# Patient Record
Sex: Female | Born: 1990 | Race: White | Hispanic: No | Marital: Single | Smoking: Never smoker
Health system: Southern US, Community
[De-identification: ages and names within clinical notes are randomized; demographics above are authoritative.]

## PROBLEM LIST (undated history)

## (undated) DIAGNOSIS — I498 Other specified cardiac arrhythmias: Secondary | ICD-10-CM

## (undated) DIAGNOSIS — G43909 Migraine, unspecified, not intractable, without status migrainosus: Secondary | ICD-10-CM

## (undated) DIAGNOSIS — F419 Anxiety disorder, unspecified: Secondary | ICD-10-CM

---
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
## 2011-03-29 ENCOUNTER — Emergency Department: Payer: Self-pay | Admitting: Unknown Physician Specialty

## 2011-10-25 ENCOUNTER — Emergency Department: Payer: Self-pay | Admitting: *Deleted

## 2011-10-26 ENCOUNTER — Emergency Department: Payer: Self-pay | Admitting: Internal Medicine

## 2012-01-15 ENCOUNTER — Emergency Department: Payer: Self-pay | Admitting: *Deleted

## 2012-06-19 ENCOUNTER — Emergency Department: Payer: Self-pay | Admitting: Emergency Medicine

## 2012-07-19 ENCOUNTER — Emergency Department: Payer: Self-pay | Admitting: Emergency Medicine

## 2012-09-25 ENCOUNTER — Emergency Department: Payer: Self-pay | Admitting: Emergency Medicine

## 2014-07-09 ENCOUNTER — Inpatient Hospital Stay: Admit: 2014-07-09 | Discharge: 2014-07-10

## 2014-07-09 NOTE — ED Notes (Signed)
Pt sent to discharge office with instructions, pt informed to follow up with PCP, all questions answered, pt stable at discharge      Purcell NailsStacy Rafi Kenneth, RN  07/09/14 1932

## 2014-07-09 NOTE — ED Provider Notes (Signed)
This patient was seen by mid-level provider only        [facility] ED  eMERGENCY dEPARTMENT eNCOUnter        Pt Name: Crystal Cole  MRN: [id number]  Birthdate 05-07-1991  Date of evaluation: 07/09/2014  Provider: Garald Balding, CNP  PCP: No primary provider on file.  ED Attending: No att. providers found    CHIEF COMPLAINT       Chief Complaint   Patient presents with   ??? Influenza     diahrrea around 1200 aches and c/o nausea to strong smells.        HISTORY OF PRESENT ILLNESS   (Location/Symptom, Timing/Onset, Context/Setting, Quality, Duration, Modifying Factors, Severity)  Note limiting factors.     Crystal Cole is a 23 y.o. female who presents to the emergency room complaining of chills and body aches with nausea.  The patient had one episode of diarrhea at noon and then starting having generalized body aches with chills.  She states that she feels nauseated whenever she smells something stronger.  She denies recent ill contacts.  Has not had a flu shot this year.  Denies any vomiting or known fever.    Nursing Notes were all reviewed and agreed with or any disagreements were addressed  in the HPI.    REVIEW OF SYSTEMS    (2-9 systems for level 4, 10 or more for level 5)     Review of Systems   Constitutional: Positive for chills. Negative for fever, activity change and appetite change.   HENT: Negative for congestion, drooling, sinus pressure, sore throat and trouble swallowing.    Respiratory: Negative for cough and shortness of breath.    Cardiovascular: Negative for chest pain.   Gastrointestinal: Negative for vomiting, abdominal pain and diarrhea.   Genitourinary: Negative for dysuria, frequency, hematuria, flank pain, vaginal bleeding, vaginal discharge and difficulty urinating.   All other systems reviewed and are negative.      Except as noted above in the ROS, all other systems were reviewed and negative.       PAST MEDICAL HISTORY   History reviewed. No pertinent past medical  history.      SURGICAL HISTORY     History reviewed. No pertinent past surgical history.      CURRENT MEDICATIONS     There are no discharge medications for this patient.        ALLERGIES     Review of patient's allergies indicates no known allergies.    FAMILY HISTORY     History reviewed. No pertinent family history.       SOCIAL HISTORY       History     Social History   ??? Marital Status: Single     Spouse Name: N/A     Number of Children: N/A   ??? Years of Education: N/A     Social History Main Topics   ??? Smoking status: Never Smoker    ??? Smokeless tobacco: None   ??? Alcohol Use: No   ??? Drug Use: No   ??? Sexual Activity: None     Other Topics Concern   ??? None     Social History Narrative   ??? None       SCREENINGS             PHYSICAL EXAM    (up to 7 for level 4, 8 or more for level 5)   ED Triage Vitals   BP Temp  Temp Source Pulse Resp SpO2 Height Weight   07/09/14 1827 07/09/14 1827 07/09/14 1827 07/09/14 1827 07/09/14 1827 07/09/14 1827 07/09/14 1827 07/09/14 1827   107/72 mmHg 99.9 ??F (37.7 ??C) Oral 93 16 95 % 5\' 5"  (1.651 m) 155 lb (70.308 kg)       Physical Exam   Constitutional: She is oriented to person, place, and time. She appears well-developed and well-nourished.   HENT:   Head: Normocephalic and atraumatic.   Right Ear: External ear normal.   Left Ear: External ear normal.   Nose: Nose normal.   Mouth/Throat: Oropharynx is clear and moist. No oropharyngeal exudate.   Neck: Normal range of motion. Neck supple.   Cardiovascular: Normal rate, regular rhythm, normal heart sounds and intact distal pulses.    Pulmonary/Chest: Effort normal and breath sounds normal. No respiratory distress. She has no wheezes.   Abdominal: Soft. Bowel sounds are normal. She exhibits no distension. There is no tenderness.   Musculoskeletal: Normal range of motion. She exhibits no edema or tenderness.   Lymphadenopathy:     She has no cervical adenopathy.   Neurological: She is oriented to person, place, and time.   Skin: Skin  is warm and dry. No rash noted. She is not diaphoretic. No erythema. No pallor.   Psychiatric: She has a normal mood and affect. Her behavior is normal. Judgment and thought content normal.   Nursing note and vitals reviewed.      DIAGNOSTIC RESULTS   LABS:    Labs Reviewed   RAPID INFLUENZA A/B ANTIGENS       All other labs were within normal range or not returned as of this dictation.    EKG: All EKG's are interpreted by the Emergency Department Physician who either signs or Co-signs this chart in the absence of a cardiologist.    NA    RADIOLOGY:   NA   PROCEDURES   Unless otherwise noted below, none     Procedures    CRITICAL CARE TIME   N/A    CONSULTS:  None      EMERGENCY DEPARTMENT COURSE and DIFFERENTIAL DIAGNOSIS/MDM:   Vitals:    Filed Vitals:    07/09/14 1827   BP: 107/72   Pulse: 93   Temp: 99.9 ??F (37.7 ??C)   TempSrc: Oral   Resp: 16   Height: 5\' 5"  (1.651 m)   Weight: 155 lb (70.308 kg)   SpO2: 95%       Patient was given the following medications:  Medications - No data to display  The patient is not febrile, tachycardic or toxic in appearance.  She presents a few hours after having one episode of diarrhea with chills and generalized body aches.  Flu swabs are negative.  No evidence for sinusitis, otitis or strep pharyngitis on exam.  She denies urinary symptoms and has no abdominal tenderness on exam.  There is no evidence for diverticulitis, appendicitis, pyelonephritis, UTI or acute surgical abdomen.  Symptoms are likely viral in nature.  The patient was given a work note for today.  Advised to rest and drink plenty of fluids.  She has ibuprofen and Tylenol at home and will take those as needed for pain.  She was given primary care referral and advised to return to the ER for any new or worse symptoms, including severe pain, persistent fevers, vomiting, new symptoms or other concerns.    The patient tolerated their visit well.  They were seen and evaluated by the  attending physician, No att.  providers found who agreed with the assessment and plan.  The patient and / or the family were informed of the results of any tests, a time was given to answer questions, a plan was proposed and they agreed with plan.        FINAL IMPRESSION      1. Diarrhea    2. Myalgia          DISPOSITION/PLAN   DISPOSITION Decision to Discharge    PAT[facility]:  [facility]  [address]  [place] [id number]  [phone]          DISCHARGE MEDICATIONS:  There are no discharge medications for this patient.      DISCONTINUED MEDICATIONS:  There are no discharge medications for this patient.             (Please note that portions of this note were completed with a voice recognition program.  Efforts were made to edit the dictations but occasionally words are mis-transcribed.)    Garald BaldingJACLYN R Yukio Bisping, CNP (electronically signed)            Garald BaldingJaclyn R Ennis Heavner, CNP  07/09/14 2306

## 2014-07-09 NOTE — Discharge Instructions (Signed)
Over the counter Ibuprofen or tylenol as needed for pain  Drink plenty of fluids  Get some rest    Return to the ER for new or worse symptoms or other concerns    Follow up with the primary care referral next week        Muscle Aches: Care Instructions  Your Care Instructions  Muscle aches have many possible causes. Some common ones are overuse, tension, and injuries such as a strained muscle. An infection such as the flu can cause muscle aches. Or the aches may be caused by some medicines, such as statins. Muscle aches may also be a symptom of a disease like lupus or fibromyalgia. Myalgia is the medical term for muscle aches.  The doctor will do a physical exam and ask questions to try to find what is causing your pain. You may also have tests such as blood tests or imaging tests like X-rays. These can help find or rule out serious problems.  The doctor has checked you carefully, but problems can develop later. If you notice any problems or new symptoms, get medical treatment right away.  Follow-up care is a key part of your treatment and safety. Be sure to make and go to all appointments, and call your doctor if you are having problems. It's also a good idea to know your test results and keep a list of the medicines you take.  How can you care for yourself at home?   Rest the area that hurts. You may need to stop or reduce the activity that causes your symptoms. Then you can return to it slowly.   Put ice or a cold pack on the area for 10 to 20 minutes at a time to ease pain. Put a thin cloth between the ice and your skin.   Take an over-the-counter pain medicine, such as acetaminophen (Tylenol), ibuprofen (Advil, Motrin), or naproxen (Aleve). Be safe with medicines. Read and follow all instructions on the label.  When should you call for help?  Call your doctor now or seek immediate medical care if:   Your pain gets worse.   You have new symptoms, such as a fever, swelling, or a rash.  Watch closely for  changes in your health, and be sure to contact your doctor if:   You do not get better as expected.   Where can you learn more?   Go to [email or web address] and sign in to your MyChart account. Enter G355 in the Search Health Information box to learn more about "Muscle Aches: Care Instructions."    If you do not have an account, please click on the "Sign Up Now" link.      2006-2015 [employer]wise, [employer]. Care instructions adapted under license by [facility]. This care instruction is for use with your licensed healthcare professional. If you have questions about a medical condition or this instruction, always ask your healthcare professional. Healthwise, Incorporated disclaims any warranty or liability for your use of this information.  Content Version: 10.6.465758; Current as of: July 01, 2013

## 2014-07-10 LAB — RAPID INFLUENZA A/B ANTIGENS
Rapid Influenza A Ag: NEGATIVE
Rapid Influenza B Ag: NEGATIVE

## 2014-09-27 ENCOUNTER — Emergency Department: Payer: Self-pay | Admitting: Emergency Medicine

## 2014-10-26 ENCOUNTER — Emergency Department: Payer: Self-pay | Admitting: Emergency Medicine

## 2015-05-08 ENCOUNTER — Emergency Department
Admission: EM | Admit: 2015-05-08 | Discharge: 2015-05-08 | Disposition: A | Discharge status: Home / Self Care | Payer: Self-pay | Attending: Emergency Medicine | Admitting: Emergency Medicine

## 2015-05-08 ENCOUNTER — Encounter: Payer: Self-pay | Admitting: Medical Oncology

## 2015-05-08 DIAGNOSIS — K029 Dental caries, unspecified: Secondary | ICD-10-CM | POA: Insufficient documentation

## 2015-05-08 DIAGNOSIS — K0889 Other specified disorders of teeth and supporting structures: Secondary | ICD-10-CM

## 2015-05-08 DIAGNOSIS — K088 Other specified disorders of teeth and supporting structures: Secondary | ICD-10-CM | POA: Insufficient documentation

## 2015-05-08 MED ORDER — AMOXICILLIN 500 MG PO TABS: 500.0000 mg | ORAL_TABLET | Freq: Two times a day (BID) | ORAL | Status: DC

## 2015-05-08 MED ORDER — HYDROCODONE-ACETAMINOPHEN 5-325 MG PO TABS: 1.0000 | ORAL_TABLET | ORAL | Status: DC | PRN

## 2015-05-08 MED ORDER — IBUPROFEN 800 MG PO TABS: 800.0000 mg | ORAL_TABLET | Freq: Three times a day (TID) | ORAL | Status: DC | PRN

## 2015-05-08 MED ORDER — LIDOCAINE VISCOUS 2 % MT SOLN: 20.0000 mL | OROMUCOSAL | Status: DC | PRN

## 2015-05-08 NOTE — ED Notes (Signed)
Wisdom tooth pain, willing to f/u up with low cost dentist.

## 2015-05-08 NOTE — ED Notes (Signed)
Lower left toothache x 1 week

## 2015-05-08 NOTE — ED Provider Notes (Signed)
[facility] Emergency Department Provider Note  ____________________________________________  Time seen: Approximately 11:56 AM  I have reviewed the triage vital signs and the nursing notes.   HISTORY  Chief Complaint Dental Pain    HPI Suzanne Day is a 24 y.o. female resents for evaluation of wisdom tooth pain. Worse left upper and lower molars area pain and irritation to the gums as well. Desires referral to local tenderness.   History reviewed. No pertinent past medical history.  There are no active problems to display for this patient.   History reviewed. No pertinent past surgical history.  Current Outpatient Rx  Name  Route  Sig  Dispense  Refill  . amoxicillin (AMOXIL) 500 MG tablet   Oral   Take 1 tablet (500 mg total) by mouth 2 (two) times daily.   20 tablet   0   . HYDROcodone-acetaminophen (NORCO) 5-325 MG per tablet   Oral   Take 1-2 tablets by mouth every 4 (four) hours as needed for moderate pain.   8 tablet   0   . ibuprofen (ADVIL,MOTRIN) 800 MG tablet   Oral   Take 1 tablet (800 mg total) by mouth every 8 (eight) hours as needed.   30 tablet   0     Allergies Review of patient's allergies indicates no known allergies.  No family history on file.  Social History Social History  Substance Use Topics  . Smoking status: Never Smoker   . Smokeless tobacco: None  . Alcohol Use: No    Review of Systems Constitutional: No fever/chills Eyes: No visual changes. ENT: No sore throat. Positive for dental pain. Cardiovascular: Denies chest pain. Respiratory: Denies shortness of breath. Gastrointestinal: No abdominal pain.  No nausea, no vomiting.  No diarrhea.  No constipation. Genitourinary: Negative for dysuria. Musculoskeletal: Negative for back pain. Skin: Negative for rash. Neurological: Negative for headaches, focal weakness or numbness.  10-point ROS otherwise  negative.  ____________________________________________   PHYSICAL EXAM:  VITAL SIGNS: ED Triage Vitals  Enc Vitals Group     BP 05/08/15 1127 118/69 mmHg     Pulse Rate 05/08/15 1127 89     Resp 05/08/15 1127 16     Temp 05/08/15 1127 98.2 F (36.8 C)     Temp Source 05/08/15 1127 Oral     SpO2 05/08/15 1127 100 %     Weight 05/08/15 1127 150 lb (68.04 kg)     Height 05/08/15 1127 5\' 5"  (1.651 m)     Head Cir --      Peak Flow --      Pain Score 05/08/15 1128 10     Pain Loc --      Pain Edu? --      Excl. in GC? --     Constitutional: Alert and oriented. Well appearing and in no acute distress. Eyes: Conjunctivae are normal. PERRL. EOMI. Head: Atraumatic. Nose: No congestion/rhinnorhea. Mouth/Throat: Mucous membranes are moist.  Oropharynx non-erythematous. Positive and was treated dental caries noted with erythematous gums Neck: No stridor.   Cardiovascular: Normal rate, regular rhythm. Grossly normal heart sounds.  Good peripheral circulation. Respiratory: Normal respiratory effort.  No retractions. Lungs CTAB. Gastrointestinal: Soft and nontender. No distention. No abdominal bruits. No CVA tenderness. Musculoskeletal: No lower extremity tenderness nor edema.  No joint effusions. Neurologic:  Normal speech and language. No gross focal neurologic deficits are appreciated. No gait instability. Skin:  Skin is warm, dry and intact. No rash noted. Psychiatric: Mood  and affect are normal. Speech and behavior are normal.  ____________________________________________   LABS (all labs ordered are listed, but only abnormal results are displayed)  Labs Reviewed - No data to display ____________________________________________     PROCEDURES  Procedure(s) performed: None  Critical Care performed: No  ____________________________________________   INITIAL IMPRESSION / ASSESSMENT AND PLAN / ED COURSE  Pertinent labs & imaging results that were available during my  care of the patient were reviewed by me and considered in my medical decision making (see chart for details).  Acute dental pain with was teeth pain. Rx given for amoxicillin 500 mg 3 times a day Motrin 800 mg 3 times a day and 8 tablets of hydrocodone. Patient encouraged to follow-up with local dentist list given. She voices no other emergency medical complaints at this time. ____________________________________________   FINAL CLINICAL IMPRESSION(S) / ED DIAGNOSES  Final diagnoses:  Pain, dental      Evangeline Dakin, PA-C 05/08/15 1218  Myrna Blazer, MD 05/08/15 [phone]

## 2015-05-08 NOTE — Discharge Instructions (Signed)
Dental Pain °A tooth ache may be caused by cavities (tooth decay). Cavities expose the nerve of the tooth to air and hot or cold temperatures. It may come from an infection or abscess (also called a boil or furuncle) around your tooth. It is also often caused by dental caries (tooth decay). This causes the pain you are having. °DIAGNOSIS  °Your caregiver can diagnose this problem by exam. °TREATMENT  °· If caused by an infection, it may be treated with medications which kill germs (antibiotics) and pain medications as prescribed by your caregiver. Take medications as directed. °· Only take over-the-counter or prescription medicines for pain, discomfort, or fever as directed by your caregiver. °· Whether the tooth ache today is caused by infection or dental disease, you should see your dentist as soon as possible for further care. °SEEK MEDICAL CARE IF: °The exam and treatment you received today has been provided on an emergency basis only. This is not a substitute for complete medical or dental care. If your problem worsens or new problems (symptoms) appear, and you are unable to meet with your dentist, call or return to this location. °SEEK IMMEDIATE MEDICAL CARE IF:  °· You have a fever. °· You develop redness and swelling of your face, jaw, or neck. °· You are unable to open your mouth. °· You have severe pain uncontrolled by pain medicine. °MAKE SURE YOU:  °· Understand these instructions. °· Will watch your condition. °· Will get help right away if you are not doing well or get worse. °Document Released: 08/20/2005 Document Revised: 11/12/2011 Document Reviewed: 04/07/2008 °ExitCare® Patient Information ©2015 [employer]. This information is not intended to replace advice given to you by your health care provider. Make sure you discuss any questions you have with your health care provider. °OPTIONS FOR DENTAL FOLLOW UP CARE ° °[place] Department of Health and Human Services - Local Safety Net Dental  Clinics °http://www.ncdhhs.gov/dph/oralhealth/services/safetynetclinics.htm °  °[facility] (336-562-3123) ° °[facility] (919-933-9087) ° °[facility] (919-663-1744 ext [phone]) ° °[facility] ([phone]) ° °[facility] ([phone]) °This clinic caters to the indigent population and is on a lottery system. °Location: °[facility], [address], [place] °Clinic Hours: °Wednesdays from 6pm - 9pm, patients seen by a lottery system. °For dates, call or go to www.med.[facility].edu/[facility]/patients/Dental-[facility] °Services: °Cleanings, fillings and simple extractions. °Payment Options: °DENTAL WORK IS FREE OF CHARGE. Bring proof of income or support. °Best way to get seen: °Arrive at 5:15 pm - this is a lottery, NOT first come/first serve, so arriving earlier will not increase your chances of being seen. °  °  °[facility] °919-537-3737 °Select option 1 for emergencies °  °Location: °[facility], [address], [place] °Clinic Hours: °No walk-ins accepted - call the day before to schedule an appointment. °Check in times are 9:30 am and 1:30 pm. °Services: °Simple extractions, temporary fillings, pulpectomy/pulp debridement, uncomplicated abscess drainage. °Payment Options: °PAYMENT IS DUE AT THE TIME OF SERVICE.  Fee is usually $100-200, additional surgical procedures (e.g. abscess drainage) may be extra. °Cash, checks, Visa/MasterCard accepted.  Can file Medicaid if patient is covered for dental - patient should call case worker to check. °No discount for [facility] patients. °Best way to get seen: °MUST call the day before and get onto the schedule. Can usually be seen the next 1-2 days. No walk-ins accepted. °  °  °[facility] °[phone] °  °Location: °[facility], [address], [place] °Clinic Hours: °M, W, Th, F 8am or 1:30pm, Tues  9a or 1:30 - first come/first served. °Services: °Simple extractions, temporary fillings, uncomplicated abscess drainage.  You do not need to be an [place] resident. °Payment Options: °PAYMENT IS DUE AT THE TIME OF SERVICE. °Dental insurance, otherwise sliding scale - bring proof of income or support. °Depending on income and treatment needed, cost is usually $50-200. °Best way to get seen: °Arrive early as it is first come/first served. °  °  °[facility] °[phone] °  °Location: °[address] °Clinic Hours: °Mon-Thu 8a-5p °Services: °Most basic dental services including extractions and fillings. °Payment Options: °PAYMENT IS DUE AT THE TIME OF SERVICE. °Sliding scale, up to 50% off - bring proof if income or support. °Medicaid with dental option accepted. °Best way to get seen: °Call to schedule an appointment, can usually be seen within 2 weeks OR they will try to see walk-ins - show up at 8a or 2p (you may have to wait). °  °  °[facility] °919-245-2435 °[place] RESIDENTS ONLY °  °Location: °[facility], [address], [place], [place] [address] °Clinic Hours: By appointment only. °Monday - Thursday 8am-5pm, Friday 8am-12pm °Services: Cleanings, fillings, extractions. °Payment Options: °PAYMENT IS DUE AT THE TIME OF SERVICE. °Cash, Visa or MasterCard. Sliding scale - $30 minimum per service. °Best way to get seen: °Come in to office, complete packet and make an appointment - need proof of income °or support monies for each household member and proof of [place] residence. °Usually takes about a month to get in. °  °  °[facility] °[phone] °  °Location: °[address] [place] °Clinic Hours: Walk-in Urgent Care Dental Services are offered Monday-Friday mornings only. °The numbers of emergencies accepted daily is limited to the number of °providers available. °Maximum 15 -  Mondays, Wednesdays & Thursdays °Maximum 10 - Tuesdays & Fridays °Services: °You do not need to be a [place] resident to be seen for a dental emergency. °Emergencies are defined as pain, swelling, abnormal bleeding, or dental trauma. Walkins will receive x-rays if needed. °NOTE: Dental cleaning is not an emergency. °Payment Options: °PAYMENT IS DUE AT THE TIME OF SERVICE. °Minimum co-pay is $40.00 for uninsured patients. °Minimum co-pay is $3.00 for Medicaid with dental coverage. °Dental Insurance is accepted and must be presented at time of visit. °Medicare does not cover dental. °Forms of payment: Cash, credit card, checks. °Best way to get seen: °If not previously registered with the clinic, walk-in dental registration begins at 7:15 am and is on a first come/first serve basis. °If previously registered with the clinic, call to make an appointment. °  °  °[facility] °919-776-4359 °[place] RESIDENTS ONLY °  °Location: °[address], [place], [place] °Clinic Hours: °Mon-Thu 10a-2p °Services: Extractions only! °Payment Options: °FREE (donations accepted) - bring proof of income or support °Best way to get seen: °Call and schedule an appointment OR come at 8am on the 1st Monday of every month (except for holidays) when it is first come/first served. °  °  °[facility] °[phone] °  °Location: °[address], [place] °Clinic Hours: °Friday mornings °Services, Payment Options, Best way to get seen: °Call for info °

## 2015-05-20 DIAGNOSIS — Z792 Long term (current) use of antibiotics: Secondary | ICD-10-CM | POA: Insufficient documentation

## 2015-05-20 DIAGNOSIS — J029 Acute pharyngitis, unspecified: Secondary | ICD-10-CM | POA: Insufficient documentation

## 2015-05-20 DIAGNOSIS — K5900 Constipation, unspecified: Secondary | ICD-10-CM | POA: Insufficient documentation

## 2015-05-20 LAB — POCT RAPID STREP A: Streptococcus, Group A Screen (Direct): NEGATIVE

## 2015-05-20 NOTE — ED Notes (Signed)
Patient reports having cold symptoms for several days with fever.  Today with runny nose and sore throat.  Patient also reports feeling constipated (last bm 2 days ago).

## 2015-05-21 ENCOUNTER — Emergency Department
Admission: EM | Admit: 2015-05-21 | Discharge: 2015-05-21 | Disposition: A | Discharge status: Home / Self Care | Payer: Self-pay | Attending: Emergency Medicine | Admitting: Emergency Medicine

## 2015-05-21 DIAGNOSIS — J029 Acute pharyngitis, unspecified: Secondary | ICD-10-CM

## 2015-05-21 DIAGNOSIS — K5900 Constipation, unspecified: Secondary | ICD-10-CM

## 2015-05-21 NOTE — Discharge Instructions (Signed)
Return for worse pain fever above 102 or if you're not feeling any better in a couple days. Drink plenty of fluids. If you still hasn't stooled  by this afternoon you can try an over-the-counter laxative.

## 2015-05-21 NOTE — ED Provider Notes (Signed)
[facility] Emergency Department Provider Note  ____________________________________________  Time seen: Approximately 5:01 AM  I have reviewed the triage vital signs and the nursing notes.   HISTORY  Chief Complaint Sore Throat; URI; and Constipation    HPI Suzanne Day is a 24 y.o. female patient reports sneezing stuffy nose and sore throat for the last 3 days with fever. Patient is feeling better today when I see her at 5 AM. Strep test is negative. She reports she hadn't stooled in 2 days.  No past medical history on file.  There are no active problems to display for this patient.   No past surgical history on file.  Current Outpatient Rx  Name  Route  Sig  Dispense  Refill  . amoxicillin (AMOXIL) 500 MG tablet   Oral   Take 1 tablet (500 mg total) by mouth 2 (two) times daily.   20 tablet   0   . HYDROcodone-acetaminophen (NORCO) 5-325 MG per tablet   Oral   Take 1-2 tablets by mouth every 4 (four) hours as needed for moderate pain.   8 tablet   0   . ibuprofen (ADVIL,MOTRIN) 800 MG tablet   Oral   Take 1 tablet (800 mg total) by mouth every 8 (eight) hours as needed.   30 tablet   0   . lidocaine (XYLOCAINE) 2 % solution   Mouth/Throat   Use as directed 20 mLs in the mouth or throat as needed for mouth pain.   100 mL   0     Allergies Review of patient's allergies indicates no known allergies.  No family history on file.  Social History Social History  Substance Use Topics  . Smoking status: Never Smoker   . Smokeless tobacco: Not on file  . Alcohol Use: No    Review of Systems Constitutional: fever Eyes: No visual changes. ENT: sore throat. Cardiovascular: Denies chest pain. Respiratory: Denies shortness of breath. Gastrointestinal: No abdominal pain.  No nausea, no vomiting.  No diarrhea.  No constipation. Genitourinary: Negative for dysuria. Musculoskeletal: Negative for back pain. Skin: Negative for  rash. Neurological: Negative for headaches, focal weakness or numbness.  10-point ROS otherwise negative.  ____________________________________________   PHYSICAL EXAM:  VITAL SIGNS: ED Triage Vitals  Enc Vitals Group     BP 05/20/15 2318 118/75 mmHg     Pulse Rate 05/20/15 2318 74     Resp 05/20/15 2318 20     Temp 05/20/15 2318 98.2 F (36.8 C)     Temp Source 05/20/15 2318 Oral     SpO2 05/20/15 2318 98 %     Weight 05/20/15 2318 155 lb (70.308 kg)     Height 05/20/15 2318 5\' 5"  (1.651 m)     Head Cir --      Peak Flow --      Pain Score 05/20/15 2318 0     Pain Loc --      Pain Edu? --      Excl. in GC? --     Constitutional: Alert and oriented. Well appearing and in no acute distress. Eyes: Conjunctivae are normal. PERRL. EOMI. Head: Atraumatic. Nosecongestion Mouth/Throat: Mucous membranes are moist.  Oropharynx non-erythematous. Neck: No stridor.  Hematological/Lymphatic/Immunilogical: No cervical lymphadenopathy. No tenderness on palpation Cardiovascular: Normal rate, regular rhythm. Grossly normal heart sounds.  Good peripheral circulation. Respiratory: Normal respiratory effort.  No retractions. Lungs CTAB. Gastrointestinal: Soft and nontender. No distention. No abdominal bruits. No CVA tenderness. Musculoskeletal: No  lower extremity tenderness nor edema.  No joint effusions. Neurologic:  Normal speech and language. No gross focal neurologic deficits are appreciated.  Skin:  Skin is warm, dry and intact. No rash noted. Psychiatric: Mood and affect are normal. Speech and behavior are normal.  ____________________________________________   LABS (all labs ordered are listed, but only abnormal results are displayed)  Labs Reviewed  POCT RAPID STREP A    ____________________________________________  EKG   ____________________________________________  RADIOLOGY   ____________________________________________   PROCEDURES   ____________________________________________   INITIAL IMPRESSION / ASSESSMENT AND PLAN / ED COURSE  Pertinent labs & imaging results that were available during my care of the patient were reviewed by me and considered in my medical decision making (see chart for details).   ____________________________________________   FINAL CLINICAL IMPRESSION(S) / ED DIAGNOSES  Final diagnoses:  Sore throat  Constipation, unspecified constipation type      Arnaldo Natal, MD 05/21/15 [phone]

## 2016-01-27 ENCOUNTER — Encounter: Payer: Self-pay | Admitting: Emergency Medicine

## 2016-01-27 ENCOUNTER — Emergency Department
Admission: EM | Admit: 2016-01-27 | Discharge: 2016-01-27 | Disposition: A | Discharge status: Home / Self Care | Payer: Worker's Compensation | Attending: Emergency Medicine | Admitting: Emergency Medicine

## 2016-01-27 DIAGNOSIS — Y9389 Activity, other specified: Secondary | ICD-10-CM | POA: Diagnosis not present

## 2016-01-27 DIAGNOSIS — Y929 Unspecified place or not applicable: Secondary | ICD-10-CM | POA: Insufficient documentation

## 2016-01-27 DIAGNOSIS — X500XXA Overexertion from strenuous movement or load, initial encounter: Secondary | ICD-10-CM | POA: Diagnosis not present

## 2016-01-27 DIAGNOSIS — Y990 Civilian activity done for income or pay: Secondary | ICD-10-CM | POA: Diagnosis not present

## 2016-01-27 DIAGNOSIS — S20221A Contusion of right back wall of thorax, initial encounter: Secondary | ICD-10-CM

## 2016-01-27 DIAGNOSIS — S300XXA Contusion of lower back and pelvis, initial encounter: Secondary | ICD-10-CM | POA: Diagnosis not present

## 2016-01-27 DIAGNOSIS — S3992XA Unspecified injury of lower back, initial encounter: Secondary | ICD-10-CM | POA: Diagnosis present

## 2016-01-27 MED ORDER — MELOXICAM 7.5 MG PO TABS: 7.5000 mg | ORAL_TABLET | Freq: Every day | ORAL | Status: AC

## 2016-01-27 MED ORDER — CYCLOBENZAPRINE HCL 5 MG PO TABS: 5.0000 mg | ORAL_TABLET | Freq: Three times a day (TID) | ORAL | Status: DC | PRN

## 2016-01-27 NOTE — ED Provider Notes (Signed)
[facility] Emergency Department Provider Note ____________________________________________  Time seen: 1149  I have reviewed the triage vital signs and the nursing notes.  HISTORY  Chief Complaint  Back Pain and Work Related Injury  HPI Suzanne Day is a 25 y.o. female presents to the ED from work following a work-related incident. She describes she was working in her usual capacity as a Geophysicist/field seismologist. She was on the floor inspecting a wrapped pallet that was about 3 feet high. She describes the fork lift operator began to lift a rapid palate behind her, as he did so, the palate grazed her calf and her lower back. It also shifted forward, causing her to have to push back against the palate ahead of her. She presents today for evaluation symptoms. She denies cough, SOB, chest pain, or flank pain. She notes the right, lower back pain that is worse with prolonged walking and standing. Pain is 2/10at rest and 5/10 at worse.   History reviewed. No pertinent past medical history.  There are no active problems to display for this patient.  History reviewed. No pertinent past surgical history.  Current Outpatient Rx  Name  Route  Sig  Dispense  Refill  . amoxicillin (AMOXIL) 500 MG tablet   Oral   Take 1 tablet (500 mg total) by mouth 2 (two) times daily.   20 tablet   0   . cyclobenzaprine (FLEXERIL) 5 MG tablet   Oral   Take 1 tablet (5 mg total) by mouth every 8 (eight) hours as needed for muscle spasms.   12 tablet   0   . HYDROcodone-acetaminophen (NORCO) 5-325 MG per tablet   Oral   Take 1-2 tablets by mouth every 4 (four) hours as needed for moderate pain.   8 tablet   0   . ibuprofen (ADVIL,MOTRIN) 800 MG tablet   Oral   Take 1 tablet (800 mg total) by mouth every 8 (eight) hours as needed.   30 tablet   0   . lidocaine (XYLOCAINE) 2 % solution   Mouth/Throat   Use as directed 20 mLs in the mouth or throat as needed for mouth pain.    100 mL   0   . meloxicam (MOBIC) 7.5 MG tablet   Oral   Take 1 tablet (7.5 mg total) by mouth daily.   15 tablet   0    Allergies Review of patient's allergies indicates no known allergies.  No family history on file.  Social History Social History  Substance Use Topics  . Smoking status: Never Smoker   . Smokeless tobacco: None  . Alcohol Use: No   Review of Systems  Constitutional: Negative for fever. Cardiovascular: Negative for chest pain. Respiratory: Negative for shortness of breath. Gastrointestinal: Negative for abdominal pain, vomiting and diarrhea. Genitourinary: Negative for dysuria. Musculoskeletal: Positive for back pain. Skin: Negative for rash. Neurological: Negative for headaches, focal weakness or numbness. ____________________________________________  PHYSICAL EXAM:  VITAL SIGNS: ED Triage Vitals  Enc Vitals Group     BP 01/27/16 1129 122/82 mmHg     Pulse Rate 01/27/16 1129 75     Resp 01/27/16 1129 18     Temp 01/27/16 1129 98.2 F (36.8 C)     Temp Source 01/27/16 1129 Oral     SpO2 01/27/16 1129 99 %     Weight 01/27/16 1129 165 lb (74.844 kg)     Height 01/27/16 1129  (1.651 m)  Head Cir --      Peak Flow --      Pain Score 01/27/16 1129 8     Pain Loc --      Pain Edu? --      Excl. in GC? --    Constitutional: Alert and oriented. Well appearing and in no distress. Head: Normocephalic and atraumatic. Cardiovascular: Normal rate, regular rhythm.  Respiratory: Normal respiratory effort. No wheezes/rales/rhonchi. Gastrointestinal: Soft and nontender. No distention, rebound, guarding, or organomegaly. No CVA tenderness. Musculoskeletal: Normal spinal alignment without midline tenderness, spasm, deformity, or step-off. Patient is tender to palpation over the right paraspinal muscles of the lumbar sacral junction. She otherwise transitions well from supine to sit. Nontender with normal range of motion in all extremities.   Neurologic: Nerves II through XII grossly intact. Normal LE DTRs bilaterally. Normal gait without ataxia. Normal speech and language. No gross focal neurologic deficits are appreciated. Skin:  Skin is warm, dry and intact. No rash noted. No abrasion, ecchymosis, swelling, or bruising noted about the posterior chest or back. ____________________________________________  INITIAL IMPRESSION / ASSESSMENT AND PLAN / ED COURSE  Patient with a lumbar contusion without evidence of respiratory distress. She'll be discharged with prescriptions for Flexeril and meloxicam the dose as needed for pain. She will return to work as scheduled after the holiday. She will follow-up with her companies urgent care provider, [facility], for ongoing symptom management. ____________________________________________  FINAL CLINICAL IMPRESSION(S) / ED DIAGNOSES  Final diagnoses:  Back contusion, right, initial encounter     Lissa HoardJenise V Bacon Charlea Nardo, PA-C 01/27/16 1257  Phineas SemenGraydon Goodman, MD 01/27/16 1553

## 2016-01-27 NOTE — ED Notes (Signed)
Patient presents to the ED for back pain post getting squished between a forklift and a palette.  Patient states she did not fall or hit her head.  Patient denies any numbness or tingling in limbs.  Patient ambulatory without obvious difficulty at this time.  Patient is filing workman's comp.

## 2016-01-27 NOTE — Discharge Instructions (Signed)
Contusion A contusion is a deep bruise. Contusions happen when an injury causes bleeding under the skin. Symptoms of bruising include pain, swelling, and discolored skin. The skin may turn blue, purple, or yellow. HOME CARE   Rest the injured area.  If told, put ice on the injured area.  Put ice in a plastic bag.  Place a towel between your skin and the bag.  Leave the ice on for 20 minutes, 2-3 times per day.  If told, put light pressure (compression) on the injured area using an elastic bandage. Make sure the bandage is not too tight. Remove it and put it back on as told by your doctor.  If possible, raise (elevate) the injured area above the level of your heart while you are sitting or lying down.  Take over-the-counter and prescription medicines only as told by your doctor. GET HELP IF:  Your symptoms do not get better after several days of treatment.  Your symptoms get worse.  You have trouble moving the injured area. GET HELP RIGHT AWAY IF:   You have very bad pain.  You have a loss of feeling (numbness) in a hand or foot.  Your hand or foot turns pale or cold.   This information is not intended to replace advice given to you by your health care provider. Make sure you discuss any questions you have with your health care provider.   Document Released: 02/06/2008 Document Revised: 05/11/2015 Document Reviewed: 01/05/2015 Elsevier Interactive Patient Education 2016 [place] Injury Prevention Back injuries can be very painful. They can also be difficult to heal. After having one back injury, you are more likely to injure your back again. It is important to learn how to avoid injuring or re-injuring your back. The following tips can help you to prevent a back injury. WHAT SHOULD I KNOW ABOUT PHYSICAL FITNESS?  Exercise for 30 minutes per day on most days of the week or as told by your doctor. Make sure to:  Do aerobic exercises, such as walking, jogging,  biking, or swimming.  Do exercises that increase balance and strength, such as tai chi and yoga.  Do stretching exercises. This helps with flexibility.  Try to develop strong belly (abdominal) muscles. Your belly muscles help to support your back.  Stay at a healthy weight. This helps to decrease your risk of a back injury. WHAT SHOULD I KNOW ABOUT MY DIET?  Talk with your doctor about your overall diet. Take supplements and vitamins only as told by your doctor.  Talk with your doctor about how much calcium and vitamin D you need each day. These nutrients help to prevent weakening of the bones (osteoporosis).  Include good sources of calcium in your diet, such as:  Dairy products.  Green leafy vegetables.  Products that have had calcium added to them (fortified).  Include good sources of vitamin D in your diet, such as:  Milk.  Foods that have had vitamin D added to them. WHAT SHOULD I KNOW ABOUT MY POSTURE?  Sit up straight and stand up straight. Avoid leaning forward when you sit or hunching over when you stand.  Choose chairs that have good low-back (lumbar) support.  If you work at a desk, sit close to it so you do not need to lean over. Keep your chin tucked in. Keep your neck drawn back. Keep your elbows bent so your arms look like the letter "L" (right angle).  Sit high and close to the steering  wheel when you drive. Add a low-back support to your car seat, if needed.  Avoid sitting or standing in one position for very long. Take breaks to get up, stretch, and walk around at least one time every hour. Take breaks every hour if you are driving for long periods of time.  Sleep on your side with your knees slightly bent, or sleep on your back with a pillow under your knees. Do not lie on the front of your body to sleep. WHAT SHOULD I KNOW ABOUT LIFTING, TWISTING, AND REACHING Lifting and Heavy Lifting  Avoid heavy lifting, especially lifting over and over again. If  you must do heavy lifting:  Stretch before lifting.  Work slowly.  Rest between lifts.  Use a tool such as a cart or a dolly to move objects if one is available.  Make several small trips instead of carrying one heavy load.  Ask for help when you need it, especially when moving big objects.  Follow these steps when lifting:  Stand with your feet shoulder-width apart.  Get as close to the object as you can. Do not pick up a heavy object that is far from your body.  Use handles or lifting straps if they are available.  Bend at your knees. Squat down, but keep your heels off the floor.  Keep your shoulders back. Keep your chin tucked in. Keep your back straight.  Lift the object slowly while you tighten the muscles in your legs, belly, and butt. Keep the object as close to the center of your body as possible.  Follow these steps when putting down a heavy load:  Stand with your feet shoulder-width apart.  Lower the object slowly while you tighten the muscles in your legs, belly, and butt. Keep the object as close to the center of your body as possible.  Keep your shoulders back. Keep your chin tucked in. Keep your back straight.  Bend at your knees. Squat down, but keep your heels off the floor.  Use handles or lifting straps if they are available. Twisting and Reaching  Avoid lifting heavy objects above your waist.  Do not twist at your waist while you are lifting or carrying a load. If you need to turn, move your feet.  Do not bend over without bending at your knees.  Avoid reaching over your head, across a table, or for an object on a high surface.  WHAT ARE SOME OTHER TIPS?  Avoid wet floors and icy ground. Keep sidewalks clear of ice to prevent falls.   Do not sleep on a mattress that is too soft or too hard.   Keep items that you use often within easy reach.   Put heavier objects on shelves at waist level, and put lighter objects on lower or higher  shelves.  Find ways to lower your stress, such as:  Exercise.  Massage.  Relaxation techniques.  Talk with your doctor if you feel anxious or depressed. These conditions can make back pain worse.  Wear flat heel shoes with cushioned soles.  Avoid making quick (sudden) movements.  Use both shoulder straps when carrying a backpack.  Do not use any tobacco products, including cigarettes, chewing tobacco, or electronic cigarettes. If you need help quitting, ask your doctor.   This information is not intended to replace advice given to you by your health care provider. Make sure you discuss any questions you have with your health care provider.   Document Released: 02/06/2008 Document Revised:  01/04/2015 Document Reviewed: 08/24/2014 Elsevier Interactive Patient Education [employer].   Your exam is essentially normal. Follow-up with [facility] for continued symptoms. Apply ice to the back for continued pain relief. Take the prescription meds as directed.

## 2016-09-23 ENCOUNTER — Emergency Department: Payer: BLUE CROSS/BLUE SHIELD

## 2016-09-23 ENCOUNTER — Encounter: Payer: Self-pay | Admitting: Emergency Medicine

## 2016-09-23 ENCOUNTER — Emergency Department
Admission: EM | Admit: 2016-09-23 | Discharge: 2016-09-23 | Disposition: A | Discharge status: Home / Self Care | Payer: BLUE CROSS/BLUE SHIELD | Attending: Emergency Medicine | Admitting: Emergency Medicine

## 2016-09-23 DIAGNOSIS — Y929 Unspecified place or not applicable: Secondary | ICD-10-CM | POA: Insufficient documentation

## 2016-09-23 DIAGNOSIS — S8011XA Contusion of right lower leg, initial encounter: Secondary | ICD-10-CM | POA: Diagnosis not present

## 2016-09-23 DIAGNOSIS — S8991XA Unspecified injury of right lower leg, initial encounter: Secondary | ICD-10-CM | POA: Diagnosis present

## 2016-09-23 DIAGNOSIS — Y9389 Activity, other specified: Secondary | ICD-10-CM | POA: Diagnosis not present

## 2016-09-23 DIAGNOSIS — Y999 Unspecified external cause status: Secondary | ICD-10-CM | POA: Diagnosis not present

## 2016-09-23 DIAGNOSIS — W182XXA Fall in (into) shower or empty bathtub, initial encounter: Secondary | ICD-10-CM | POA: Insufficient documentation

## 2016-09-23 MED ORDER — HYDROCODONE-ACETAMINOPHEN 5-325 MG PO TABS: 1.0000 | ORAL_TABLET | ORAL | 0 refills | Status: DC | PRN

## 2016-09-23 MED ORDER — MORPHINE SULFATE (PF) 4 MG/ML IV SOLN
INTRAVENOUS | Status: AC
  Administered 2016-09-23: 4 mg via INTRAVENOUS
  Filled 2016-09-23: qty 1

## 2016-09-23 MED ORDER — ONDANSETRON HCL 4 MG/2ML IJ SOLN
4.0000 mg | Freq: Once | INTRAMUSCULAR | Status: AC
  Administered 2016-09-23: 4 mg via INTRAVENOUS

## 2016-09-23 MED ORDER — ONDANSETRON HCL 4 MG/2ML IJ SOLN
INTRAMUSCULAR | Status: AC
  Administered 2016-09-23: 4 mg via INTRAVENOUS
  Filled 2016-09-23: qty 2

## 2016-09-23 MED ORDER — MORPHINE SULFATE (PF) 4 MG/ML IV SOLN
4.0000 mg | Freq: Once | INTRAVENOUS | Status: AC
  Administered 2016-09-23: 4 mg via INTRAVENOUS

## 2016-09-23 NOTE — ED Provider Notes (Signed)
[facility] Emergency Department Provider Note  Time seen: 7:20 PM  I have reviewed the triage vital signs and the nursing notes.   HISTORY  Chief Complaint Leg Injury    HPI Suzanne Day is a 26 y.o. female with no past medical history who presents the emergent department with right leg pain. According to the patient partially 2 hours ago she was trying to get into the bathtub when she slipped hitting her right tibia on the bathtub ledge causing her to fall into the bathtub. Denies any other injuries. Denies hitting her head or loss of consciousness. Currently the patient appears well, no distress however significant pain when touching the area.  History reviewed. No pertinent past medical history.  There are no active problems to display for this patient.   History reviewed. No pertinent surgical history.  Prior to Admission medications   Medication Sig Start Date End Date Taking? Authorizing Provider  amoxicillin (AMOXIL) 500 MG tablet Take 1 tablet (500 mg total) by mouth 2 (two) times daily. 05/08/15   Charmayne Sheerharles M Beers, PA-C  cyclobenzaprine (FLEXERIL) 5 MG tablet Take 1 tablet (5 mg total) by mouth every 8 (eight) hours as needed for muscle spasms. 01/27/16   Jenise V Bacon Menshew, PA-C  HYDROcodone-acetaminophen (NORCO) 5-325 MG per tablet Take 1-2 tablets by mouth every 4 (four) hours as needed for moderate pain. 05/08/15   Charmayne Sheerharles M Beers, PA-C  ibuprofen (ADVIL,MOTRIN) 800 MG tablet Take 1 tablet (800 mg total) by mouth every 8 (eight) hours as needed. 05/08/15   Charmayne Sheerharles M Beers, PA-C  lidocaine (XYLOCAINE) 2 % solution Use as directed 20 mLs in the mouth or throat as needed for mouth pain. 05/08/15   Charmayne Sheerharles M Beers, PA-C  meloxicam (MOBIC) 7.5 MG tablet Take 1 tablet (7.5 mg total) by mouth daily. 01/27/16 01/26/17  Jenise V Bacon Menshew, PA-C    No Known Allergies  History reviewed. No pertinent family history.  Social History Social History   Substance Use Topics  . Smoking status: Never Smoker  . Smokeless tobacco: Never Used  . Alcohol use No    Review of Systems Constitutional: Negative for fever. Cardiovascular: Negative for chest pain. Respiratory: Negative for shortness of breath. Gastrointestinal: Negative for abdominal pain, vomiting and diarrhea. Musculoskeletal: Right lower leg pain Neurological: Negative for headache 10-point ROS otherwise negative.  ____________________________________________   PHYSICAL EXAM:  VITAL SIGNS: ED Triage Vitals  Enc Vitals Group     BP 09/23/16 1908 110/79     Pulse Rate 09/23/16 1908 69     Resp 09/23/16 1908 18     Temp 09/23/16 1908 98 F (36.7 C)     Temp src --      SpO2 09/23/16 1908 99 %     Weight 09/23/16 1901 165 lb (74.8 kg)     Height 09/23/16 1901 5\' 5"  (1.651 m)     Head Circumference --      Peak Flow --      Pain Score 09/23/16 1902 5     Pain Loc --      Pain Edu? --      Excl. in GC? --     Constitutional: Alert and oriented. Well appearing and in no distress. Eyes: Normal exam ENT   Head: Normocephalic and atraumatic   Mouth/Throat: Mucous membranes are moist. Cardiovascular: Normal rate, regular rhythm.  Respiratory: Normal respiratory effort without tachypnea nor retractions. Breath sounds are clear  Gastrointestinal: Soft and nontender.  No distention Musculoskeletal: Contusion/ecchymosis noted to the mid to distal tibia, with significant tenderness to palpation of this area. No obvious deformity. Neurovascular intact with 2+ DP pulse. Able to move all toes. Skin intact. Neurologic:  Normal speech and language. No gross focal neurologic deficits  Psychiatric: Mood and affect are normal.   ____________________________________________   RADIOLOGY  X-ray negative  ____________________________________________   INITIAL IMPRESSION / ASSESSMENT AND PLAN / ED COURSE  Pertinent labs & imaging results that were available during my  care of the patient were reviewed by me and considered in my medical decision making (see chart for details).  The patient presents the emergency department with right leg pain after fall onto her bathtub ledge. Patient has significant tenderness to palpation. We will treat pain and obtain an x-ray to further evaluate. Overall the patient appears well, denies any other injuries, no other signs of trauma on exam.  X-ray is negative. We will discharge with crutches with weightbearing as tolerated, pain medication and primary care follow-up. ____________________________________________   FINAL CLINICAL IMPRESSION(S) / ED DIAGNOSES  Right leg contusion    Minna Antis, MD 09/23/16 2040

## 2016-09-23 NOTE — Discharge Instructions (Signed)
Please use crutches and bear weight on her leg as tolerated. Please use pain medication as needed, as prescribed. Return to the emergency department for any increased pain or any other symptom [place] concerning to self. Otherwise please follow-up with a primary care physician in approximately 1 week for recheck and reevaluation.

## 2016-09-23 NOTE — ED Triage Notes (Signed)
Slipped in bath tub and injured rt leg in the tib/fib area. Given fentanyl 75 mcg by ems

## 2018-01-14 ENCOUNTER — Other Ambulatory Visit: Payer: Self-pay

## 2018-01-14 ENCOUNTER — Encounter: Payer: Self-pay | Admitting: Emergency Medicine

## 2018-01-14 ENCOUNTER — Emergency Department
Admission: EM | Admit: 2018-01-14 | Discharge: 2018-01-14 | Disposition: A | Discharge status: Home / Self Care | Payer: BLUE CROSS/BLUE SHIELD | Attending: Emergency Medicine | Admitting: Emergency Medicine

## 2018-01-14 DIAGNOSIS — Y929 Unspecified place or not applicable: Secondary | ICD-10-CM | POA: Insufficient documentation

## 2018-01-14 DIAGNOSIS — X58XXXA Exposure to other specified factors, initial encounter: Secondary | ICD-10-CM | POA: Diagnosis not present

## 2018-01-14 DIAGNOSIS — Z79899 Other long term (current) drug therapy: Secondary | ICD-10-CM | POA: Diagnosis not present

## 2018-01-14 DIAGNOSIS — Y9389 Activity, other specified: Secondary | ICD-10-CM | POA: Insufficient documentation

## 2018-01-14 DIAGNOSIS — Y999 Unspecified external cause status: Secondary | ICD-10-CM | POA: Diagnosis not present

## 2018-01-14 DIAGNOSIS — T18108A Unspecified foreign body in esophagus causing other injury, initial encounter: Secondary | ICD-10-CM

## 2018-01-14 DIAGNOSIS — T18128A Food in esophagus causing other injury, initial encounter: Secondary | ICD-10-CM | POA: Insufficient documentation

## 2018-01-14 DIAGNOSIS — R0989 Other specified symptoms and signs involving the circulatory and respiratory systems: Secondary | ICD-10-CM | POA: Diagnosis present

## 2018-01-14 LAB — POCT PREGNANCY, URINE: Preg Test, Ur: NEGATIVE

## 2018-01-14 LAB — COMPREHENSIVE METABOLIC PANEL
ALK PHOS: 57 U/L (ref 38–126)
ALT: 20 U/L (ref 14–54)
ANION GAP: 6 (ref 5–15)
AST: 25 U/L (ref 15–41)
Albumin: 3.8 g/dL (ref 3.5–5.0)
BILIRUBIN TOTAL: 0.4 mg/dL (ref 0.3–1.2)
BUN: 8 mg/dL (ref 6–20)
CALCIUM: 8.6 mg/dL — AB (ref 8.9–10.3)
CO2: 28 mmol/L (ref 22–32)
Chloride: 103 mmol/L (ref 101–111)
Creatinine, Ser: 0.69 mg/dL (ref 0.44–1.00)
GFR calc Af Amer: >60 mL/min (ref >60)
Glucose, Bld: 99 mg/dL (ref 65–99)
POTASSIUM: 3.3 mmol/L — AB (ref 3.5–5.1)
Sodium: 137 mmol/L (ref 135–145)
TOTAL PROTEIN: 7.1 g/dL (ref 6.5–8.1)

## 2018-01-14 LAB — URINALYSIS, COMPLETE (UACMP) WITH MICROSCOPIC
BACTERIA UA: NONE SEEN
Bilirubin Urine: NEGATIVE
GLUCOSE, UA: NEGATIVE mg/dL
HGB URINE DIPSTICK: NEGATIVE
Ketones, ur: 5 mg/dL — AB
NITRITE: NEGATIVE
PH: 6 (ref 5.0–8.0)
Protein, ur: 30 mg/dL — AB
SPECIFIC GRAVITY, URINE: 1.031 — AB (ref 1.005–1.030)

## 2018-01-14 LAB — LIPASE, BLOOD: Lipase: 25 U/L (ref 11–51)

## 2018-01-14 LAB — CBC
HEMATOCRIT: 37.5 % (ref 35.0–47.0)
Hemoglobin: 12.4 g/dL (ref 12.0–16.0)
MCH: 27.7 pg (ref 26.0–34.0)
MCHC: 33.1 g/dL (ref 32.0–36.0)
MCV: 83.6 fL (ref 80.0–100.0)
Platelets: 287 10*3/uL (ref 150–440)
RBC: 4.48 MIL/uL (ref 3.80–5.20)
RDW: 13.5 % (ref 11.5–14.5)
WBC: 8.8 10*3/uL (ref 3.6–11.0)

## 2018-01-14 LAB — TROPONIN I: Troponin I: <0.03 ng/mL (ref <0.03)

## 2018-01-14 NOTE — Discharge Instructions (Addendum)
We believe we got all the chicken out of your throat, return to the emergency room for increased pain, swelling, fever trouble swallowing trouble breathing, or other concerns.  Follow closely with primary care doctor.

## 2018-01-14 NOTE — ED Triage Notes (Addendum)
Patient ambulatory to triage with steady gait, without difficulty or distress noted; pt reports N/V today since 4pm, st "feels food lodged in throat"; c/o left upper abd pain; st had diarrhea last wk as well with blood noted

## 2018-01-14 NOTE — ED Provider Notes (Addendum)
[facility] Emergency Department Provider Note  ____________________________________________   I have reviewed the triage vital signs and the nursing notes. Where available I have reviewed prior notes and, if possible and indicated, outside hospital notes.    HISTORY  Chief Complaint Abdominal Pain    HPI Suzanne Day is a 27 y.o. female is here because she has a piece of chicken stuck in the back of her throat, she can see Epogen can get it out and it is making her gag.  She has had diarrhea in the past is not having diarrhea today, she did not have any vomiting associated chicken and felt that piece of chicken in the back of the throat that was making her gag.  She has no other complaints.  Witness of breath, able to handle secretions with no difficulty etc. He can see the chicken in her posterior pharynx and was try to get it out but unsuccessfully     History reviewed. No pertinent past medical history.  There are no active problems to display for this patient.   History reviewed. No pertinent surgical history.  Prior to Admission medications   Medication Sig Start Date End Date Taking? Authorizing Provider  amoxicillin (AMOXIL) 500 MG tablet Take 1 tablet (500 mg total) by mouth 2 (two) times daily. 05/08/15   Beers, Charmayne Sheer, PA-C  cyclobenzaprine (FLEXERIL) 5 MG tablet Take 1 tablet (5 mg total) by mouth every 8 (eight) hours as needed for muscle spasms. 01/27/16   Menshew, Charlesetta Ivory, PA-C  HYDROcodone-acetaminophen (NORCO/VICODIN) 5-325 MG tablet Take 1 tablet by mouth every 4 (four) hours as needed. 09/23/16   Minna Antis, MD  ibuprofen (ADVIL,MOTRIN) 800 MG tablet Take 1 tablet (800 mg total) by mouth every 8 (eight) hours as needed. 05/08/15   Beers, Charmayne Sheer, PA-C  lidocaine (XYLOCAINE) 2 % solution Use as directed 20 mLs in the mouth or throat as needed for mouth pain. 05/08/15   Beers, Charmayne Sheer, PA-C    Allergies Patient has no  known allergies.  No family history on file.  Social History Social History   Tobacco Use  . Smoking status: Never Smoker  . Smokeless tobacco: Never Used  Substance Use Topics  . Alcohol use: Yes    Comment: weekends  . Drug use: Yes    Types: Cocaine    Review of Systems Constitutional: No fever/chills Eyes: No visual changes. ENT: No sore throat. No stiff neck no neck pain Cardiovascular: Denies chest pain. Respiratory: Denies shortness of breath. Gastrointestinal:   See HPI genitourinary: Negative for dysuria. Musculoskeletal: Negative lower extremity swelling Skin: Negative for rash. Neurological: Negative for severe headaches, focal weakness or numbness.   ____________________________________________   PHYSICAL EXAM:  VITAL SIGNS: ED Triage Vitals [01/14/18 1919]  Enc Vitals Group     BP (!) 122/92     Pulse Rate 71     Resp 18     Temp 98.4 F (36.9 C)     Temp Source Oral     SpO2 100 %     Weight 160 lb (72.6 kg)     Height  (1.651 m)     Head Circumference      Peak Flow      Pain Score 4     Pain Loc      Pain Edu?      Excl. in GC?     Constitutional: Alert and oriented. Well appearing and in no acute distress.  Eyes: Conjunctivae are normal Head: Atraumatic HEENT: No congestion/rhinnorhea. Mucous membranes are moist.  Oropharynx non-erythematous With a tongue depressor I am able to see a small piece of chicken underneath the left tonsil which is causing her distress.  No erythema no swelling no other lesions noted Neck:   Nontender with no meningismus, no masses, no stridor Cardiovascular: Normal rate, regular rhythm. Grossly normal heart sounds.  Good peripheral circulation. Respiratory: Normal respiratory effort.  No retractions. Lungs CTAB. Abdominal: Soft and nontender. No distention. No guarding no rebound Back:  There is no focal tenderness or step off.  there is no midline tenderness there are no lesions noted. there is no CVA  tenderness Musculoskeletal: No lower extremity tenderness, no upper extremity tenderness. No joint effusions, no DVT signs strong distal pulses no edema Neurologic:  Normal speech and language. No gross focal neurologic deficits are appreciated.  Skin:  Skin is warm, dry and intact. No rash noted. Psychiatric: Mood and affect are normal. Speech and behavior are normal.  ____________________________________________   LABS (all labs ordered are listed, but only abnormal results are displayed)  Labs Reviewed  COMPREHENSIVE METABOLIC PANEL - Abnormal; Notable for the following components:      Result Value   Potassium 3.3 (*)    Calcium 8.6 (*)    All other components within normal limits  URINALYSIS, COMPLETE (UACMP) WITH MICROSCOPIC - Abnormal; Notable for the following components:   Color, Urine YELLOW (*)    APPearance CLOUDY (*)    Specific Gravity, Urine 1.031 (*)    Ketones, ur 5 (*)    Protein, ur 30 (*)    Leukocytes, UA LARGE (*)    All other components within normal limits  URINE CULTURE  LIPASE, BLOOD  CBC  TROPONIN I  POC URINE PREG, ED  POCT PREGNANCY, URINE    Pertinent labs  results that were available during my care of the patient were reviewed by me and considered in my medical decision making (see chart for details). ____________________________________________  EKG  I personally interpreted any EKGs ordered by me or triage Normal sinus rhythm at 64 bpm no acute ST elevation no acute ST depression normal axis unremarkable EKG ____________________________________________  RADIOLOGY  Pertinent labs & imaging results that were available during my care of the patient were reviewed by me and considered in my medical decision making (see chart for details). If possible, patient and/or family made aware of any abnormal findings.  No results found. ____________________________________________    PROCEDURES  Procedure(s) performed: Level of chicken, with a  nurse present holding the light I took a small alligator forceps and was able to retrieve the small piece of meat that was underneath her tonsil.  I do not see evidence of retained chicken.  Patient consented to procedure prior to it being done, timeout was performed, no sedation or medications were indicated tolerated without any complications  Procedures    Critical Care performed: None  ____________________________________________   INITIAL IMPRESSION / ASSESSMENT AND PLAN / ED COURSE  Pertinent labs & imaging results that were available during my care of the patient were reviewed by me and considered in my medical decision making (see chart for details).  There is a very small piece of chicken in the patient's posterior pharynx, after I pulled it out all of her symptoms went away, no evidence of swelling angioedema, oral or GI obstruction etc.  No evidence of respiratory pathology.  No evidence of anaphylaxis.  She just had a  piece of chicken stuck underneath her tonsil that was making her gag and she feels 100% better now.  We will have her follow close with primary care return precautions and follow-up given and understood.    ____________________________________________   FINAL CLINICAL IMPRESSION(S) / ED DIAGNOSES  Final diagnoses:  None      This chart was dictated using voice recognition software.  Despite best efforts to proofread,  errors can occur which can change meaning.      Jeanmarie Plant, MD 01/14/18 2135    Jeanmarie Plant, MD 01/14/18 2303

## 2018-01-16 LAB — URINE CULTURE

## 2018-08-10 ENCOUNTER — Encounter: Payer: Self-pay | Admitting: Emergency Medicine

## 2018-08-10 ENCOUNTER — Emergency Department: Payer: Self-pay

## 2018-08-10 DIAGNOSIS — R251 Tremor, unspecified: Secondary | ICD-10-CM | POA: Insufficient documentation

## 2018-08-10 DIAGNOSIS — R0789 Other chest pain: Secondary | ICD-10-CM | POA: Insufficient documentation

## 2018-08-10 DIAGNOSIS — F1410 Cocaine abuse, uncomplicated: Secondary | ICD-10-CM | POA: Insufficient documentation

## 2018-08-10 LAB — BASIC METABOLIC PANEL
Anion gap: 7 (ref 5–15)
BUN: 9 mg/dL (ref 6–20)
CALCIUM: 8.9 mg/dL (ref 8.9–10.3)
CO2: 27 mmol/L (ref 22–32)
Chloride: 105 mmol/L (ref 98–111)
Creatinine, Ser: 0.77 mg/dL (ref 0.44–1.00)
Glucose, Bld: 117 mg/dL — ABNORMAL HIGH (ref 70–99)
Potassium: 3.2 mmol/L — ABNORMAL LOW (ref 3.5–5.1)
Sodium: 139 mmol/L (ref 135–145)

## 2018-08-10 LAB — CBC
HCT: 39.2 % (ref 36.0–46.0)
Hemoglobin: 13 g/dL (ref 12.0–15.0)
MCH: 27.8 pg (ref 26.0–34.0)
MCHC: 33.2 g/dL (ref 30.0–36.0)
MCV: 83.8 fL (ref 80.0–100.0)
NRBC: 0 % (ref 0.0–0.2)
PLATELETS: 362 10*3/uL (ref 150–400)
RBC: 4.68 MIL/uL (ref 3.87–5.11)
RDW: 13.2 % (ref 11.5–15.5)
WBC: 9.2 10*3/uL (ref 4.0–10.5)

## 2018-08-10 LAB — POCT PREGNANCY, URINE: Preg Test, Ur: NEGATIVE

## 2018-08-10 LAB — TROPONIN I

## 2018-08-10 NOTE — ED Notes (Signed)
Pt assisted to wheelchair upon arrival; c/o chest pain since 3am, after using cocaine; says "I just can't get it to go away"; HR 125

## 2018-08-10 NOTE — ED Triage Notes (Signed)
Patient states that she used cocaine about 04:00 this morning and has had chest pain and feeling like her heart was racing since then.

## 2018-08-11 ENCOUNTER — Emergency Department
Admission: EM | Admit: 2018-08-11 | Discharge: 2018-08-11 | Disposition: A | Discharge status: Home / Self Care | Payer: Self-pay | Attending: Emergency Medicine | Admitting: Emergency Medicine

## 2018-08-11 DIAGNOSIS — F1410 Cocaine abuse, uncomplicated: Secondary | ICD-10-CM

## 2018-08-11 DIAGNOSIS — R0789 Other chest pain: Secondary | ICD-10-CM

## 2018-08-11 LAB — HEPATIC FUNCTION PANEL
ALBUMIN: 4.3 g/dL (ref 3.5–5.0)
ALK PHOS: 52 U/L (ref 38–126)
ALT: 12 U/L (ref 0–44)
AST: 16 U/L (ref 15–41)
BILIRUBIN TOTAL: 0.4 mg/dL (ref 0.3–1.2)
Bilirubin, Direct: <0.1 mg/dL (ref 0.0–0.2)
Total Protein: 7.6 g/dL (ref 6.5–8.1)

## 2018-08-11 LAB — CK: Total CK: 46 U/L (ref 38–234)

## 2018-08-11 LAB — TROPONIN I: Troponin I: <0.03 ng/mL (ref <0.03)

## 2018-08-11 MED ORDER — SODIUM CHLORIDE 0.9 % IV BOLUS
1000.0000 mL | Freq: Once | INTRAVENOUS | Status: AC
  Administered 2018-08-11: 1000 mL via INTRAVENOUS

## 2018-08-11 MED ORDER — LORAZEPAM 2 MG/ML IJ SOLN
2.0000 mg | Freq: Once | INTRAMUSCULAR | Status: AC
  Administered 2018-08-11: 2 mg via INTRAVENOUS
  Filled 2018-08-11: qty 1

## 2018-08-11 NOTE — ED Provider Notes (Signed)
[facility] Emergency Department Provider Note  ____________________________________________   First MD Initiated Contact with Patient 08/11/18 0041     (approximate)  I have reviewed the triage vital signs and the nursing notes.   HISTORY  Chief Complaint Chest Pain   HPI Suzanne Day is a 27 y.o. female who comes to the emergency department with chest pain and tremors ever since insufflating cocaine roughly 20 hours ago.  She is a frequent cocaine user using every weekend for the past several months.  She has never had chest pain or tremors which concerned her.  Her chest pain was described as squeezing and aching in her upper chest.  It was constant and has slowly improved with time but she feels very anxious.  Denies fevers or chills.  No history of sudden cardiac death in the family.  No personal history of coronary artery disease.  No history of DVT or pulmonary embolism.  No recent surgery travel or immobilization.  Her symptoms were sudden onset and constant and seem to be worsened by cocaine use and improved with time.    History reviewed. No pertinent past medical history.  There are no active problems to display for this patient.   History reviewed. No pertinent surgical history.  Prior to Admission medications   Medication Sig Start Date End Date Taking? Authorizing Provider  amoxicillin (AMOXIL) 500 MG tablet Take 1 tablet (500 mg total) by mouth 2 (two) times daily. 05/08/15   Beers, Charmayne Sheer, PA-C  cyclobenzaprine (FLEXERIL) 5 MG tablet Take 1 tablet (5 mg total) by mouth every 8 (eight) hours as needed for muscle spasms. 01/27/16   Menshew, Charlesetta Ivory, PA-C  HYDROcodone-acetaminophen (NORCO/VICODIN) 5-325 MG tablet Take 1 tablet by mouth every 4 (four) hours as needed. 09/23/16   Minna Antis, MD  ibuprofen (ADVIL,MOTRIN) 800 MG tablet Take 1 tablet (800 mg total) by mouth every 8 (eight) hours as needed. 05/08/15   Beers, Charmayne Sheer, PA-C  lidocaine (XYLOCAINE) 2 % solution Use as directed 20 mLs in the mouth or throat as needed for mouth pain. 05/08/15   Beers, Charmayne Sheer, PA-C    Allergies Patient has no known allergies.  No family history on file.  Social History Social History   Tobacco Use  . Smoking status: Never Smoker  . Smokeless tobacco: Never Used  Substance Use Topics  . Alcohol use: Yes    Comment: occ  . Drug use: Yes    Types: Cocaine    Review of Systems Constitutional: No fever/chills Eyes: No visual changes. ENT: No sore throat. Cardiovascular: Positive for chest pain. Respiratory: Denies shortness of breath. Gastrointestinal: No abdominal pain.  No nausea, no vomiting.  No diarrhea.  No constipation. Genitourinary: Negative for dysuria. Musculoskeletal: Negative for back pain. Skin: Negative for rash. Neurological: Negative for headaches, focal weakness or numbness.   ____________________________________________   PHYSICAL EXAM:  VITAL SIGNS: ED Triage Vitals  Enc Vitals Group     BP 08/10/18 2020 122/81     Pulse Rate 08/10/18 2019 (!) 109     Resp 08/10/18 2019 18     Temp 08/10/18 2022 98.8 F (37.1 C)     Temp src --      SpO2 08/10/18 2019 100 %     Weight 08/10/18 2020 170 lb (77.1 kg)     Height 08/10/18 2020 5\' 5"  (1.651 m)     Head Circumference --      Peak Flow --  Pain Score 08/10/18 2020 6     Pain Loc --      Pain Edu? --      Excl. in GC? --     Constitutional: Alert and oriented x4 somewhat anxious appearing although nontoxic no diaphoresis speaks in full clear sentences Eyes: PERRL EOMI. slightly dilated and brisk Head: Atraumatic. Nose: No congestion/rhinnorhea. Mouth/Throat: No trismus Neck: No stridor.   Cardiovascular: Tachycardic rate, regular rhythm. Grossly normal heart sounds.  Good peripheral circulation. Respiratory: Normal respiratory effort.  No retractions. Lungs CTAB and moving good air Gastrointestinal: Soft  nontender Musculoskeletal: No lower extremity edema   Neurologic:  Normal speech and language. No gross focal neurologic deficits are appreciated. Skin:  Skin is warm, dry and intact. No rash noted. Psychiatric: Anxious appearing   ____________________________________________   DIFFERENTIAL includes but not limited to  Vasospasm, acute coronary syndrome, coronary artery dissection, cocaine intoxication, cocaine overdose, ventricular tachycardia ____________________________________________   LABS (all labs ordered are listed, but only abnormal results are displayed)  Labs Reviewed  BASIC METABOLIC PANEL - Abnormal; Notable for the following components:      Result Value   Potassium 3.2 (*)    Glucose, Bld 117 (*)    All other components within normal limits  CBC  TROPONIN I  HEPATIC FUNCTION PANEL  TROPONIN I  CK  POCT PREGNANCY, URINE    Lab work reviewed by me with no signs of acute ischemia x2 __________________________________________  EKG  ED ECG REPORT I, Merrily Brittle, the attending physician, personally viewed and interpreted this ECG.  Date: 08/12/2018 EKG Time:  Rate: 97 Rhythm: normal sinus rhythm QRS Axis: normal Intervals: normal ST/T Wave abnormalities: normal Narrative Interpretation: no evidence of acute ischemia  ____________________________________________  RADIOLOGY  X-ray reviewed by me with no acute disease ____________________________________________   PROCEDURES  Procedure(s) performed: yes  Angiocath insertion Performed by: Merrily Brittle  Consent: Verbal consent obtained. Risks and benefits: risks, benefits and alternatives were discussed Time out: Immediately prior to procedure a "time out" was called to verify the correct patient, procedure, equipment, support staff and site/side marked as required.  Preparation: Patient was prepped and draped in the usual sterile fashion.  Vein Location: Right antecubital fossa  Gauge:  20  Normal blood return and flush without difficulty Patient tolerance: Patient tolerated the procedure well with no immediate complications.     Procedures  Critical Care performed: no  ____________________________________________   INITIAL IMPRESSION / ASSESSMENT AND PLAN / ED COURSE  Pertinent labs & imaging results that were available during my care of the patient were reviewed by me and considered in my medical decision making (see chart for details).   As part of my medical decision making, I reviewed the following data within the electronic MEDICAL RECORD NUMBER History obtained from family if available, nursing notes, old chart and ekg, as well as notes from prior ED visits.  Patient comes to the emergency department with chest pain and palpitations roughly 20 hours after insufflating cocaine.  She does appear somewhat anxious so we have establish IV access and given her 2 mg of lorazepam as she is not driving.  Lab work is pending at this time.  The patient was kept on monitor multiple hours with no ectopy.  Her EKG is reassuring and troponin is negative x2.  She feels complete relief after lorazepam.  We had a lengthy discussion regarding the dangers of cocaine and she expresses a desire to stop using.  Discharged home in improved  condition.      ____________________________________________   FINAL CLINICAL IMPRESSION(S) / ED DIAGNOSES  Final diagnoses:  Atypical chest pain  Cocaine abuse (HCC)      NEW MEDICATIONS STARTED DURING THIS VISIT:  Discharge Medication List as of 08/11/2018  1:39 AM       Note:  This document was prepared using Dragon voice recognition software and may include unintentional dictation errors.     Merrily Brittleifenbark, Michalle Rademaker, MD 08/12/18 [phone]

## 2018-08-11 NOTE — Discharge Instructions (Signed)
Please refrain from using cocaine in the future - it's extremely dangerous for your health.  Fortunately today your lab work was reassuring.  Follow up with your PMD within 1 week for a recheck.  It was a pleasure to take care of you today, and thank you for coming to our emergency department.  If you have any questions or concerns before leaving please ask the nurse to grab me and I'm more than happy to go through your aftercare instructions again.  If you were prescribed any opioid pain medication today such as Norco, Vicodin, Percocet, morphine, hydrocodone, or oxycodone please make sure you do not drive when you are taking this medication as it can alter your ability to drive safely.  If you have any concerns once you are home that you are not improving or are in fact getting worse before you can make it to your follow-up appointment, please do not hesitate to call 911 and come back for further evaluation.  Merrily BrittleNeil Lollie Gunner, MD  Results for orders placed or performed during the hospital encounter of 08/11/18  Basic metabolic panel  Result Value Ref Range   Sodium 139 135 - 145 mmol/L   Potassium 3.2 (L) 3.5 - 5.1 mmol/L   Chloride 105 98 - 111 mmol/L   CO2 27 22 - 32 mmol/L   Glucose, Bld 117 (H) 70 - 99 mg/dL   BUN 9 6 - 20 mg/dL   Creatinine, Ser 1.610.77 0.44 - 1.00 mg/dL   Calcium 8.9 8.9 - 09.610.3 mg/dL   GFR calc non Af Amer >60 >60 mL/min   GFR calc Af Amer >60 >60 mL/min   Anion gap 7 5 - 15  CBC  Result Value Ref Range   WBC 9.2 4.0 - 10.5 K/uL   RBC 4.68 3.87 - 5.11 MIL/uL   Hemoglobin 13.0 12.0 - 15.0 g/dL   HCT 04.539.2 40.936.0 - 81.146.0 %   MCV 83.8 80.0 - 100.0 fL   MCH 27.8 26.0 - 34.0 pg   MCHC 33.2 30.0 - 36.0 g/dL   RDW 91.413.2 78.211.5 - 95.615.5 %   Platelets 362 150 - 400 K/uL   nRBC 0.0 0.0 - 0.2 %  Troponin I - ONCE - STAT  Result Value Ref Range   Troponin I <0.03 <0.03 ng/mL  Hepatic function panel  Result Value Ref Range   Total Protein 7.6 6.5 - 8.1 g/dL   Albumin 4.3 3.5 -  5.0 g/dL   AST 16 15 - 41 U/L   ALT 12 0 - 44 U/L   Alkaline Phosphatase 52 38 - 126 U/L   Total Bilirubin 0.4 0.3 - 1.2 mg/dL   Bilirubin, Direct <2.1<0.1 0.0 - 0.2 mg/dL   Indirect Bilirubin NOT CALCULATED 0.3 - 0.9 mg/dL  Troponin I - Once  Result Value Ref Range   Troponin I <0.03 <0.03 ng/mL  CK  Result Value Ref Range   Total CK 46 38 - 234 U/L  Pregnancy, urine POC  Result Value Ref Range   Preg Test, Ur NEGATIVE NEGATIVE   Dg Chest 2 View  Result Date: 08/10/2018 CLINICAL DATA:  Chest pain after cocaine use EXAM: CHEST - 2 VIEW COMPARISON:  None. FINDINGS: Lungs are clear.  No pleural effusion or pneumothorax. The heart is normal in size. Visualized osseous structures are within normal limits. IMPRESSION: Normal chest radiographs. Electronically Signed   By: Charline BillsSriyesh  Krishnan M.D.   On: 08/10/2018 21:09

## 2018-09-25 ENCOUNTER — Inpatient Hospital Stay (HOSPITAL_COMMUNITY): Admit: 2018-09-25 | Payer: Self-pay

## 2019-07-05 ENCOUNTER — Encounter: Payer: Self-pay | Admitting: Emergency Medicine

## 2019-07-05 ENCOUNTER — Other Ambulatory Visit: Payer: Self-pay

## 2019-07-05 DIAGNOSIS — R197 Diarrhea, unspecified: Secondary | ICD-10-CM | POA: Insufficient documentation

## 2019-07-05 DIAGNOSIS — Z5321 Procedure and treatment not carried out due to patient leaving prior to being seen by health care provider: Secondary | ICD-10-CM | POA: Insufficient documentation

## 2019-07-05 NOTE — ED Triage Notes (Signed)
Pt was seen by urgent care earlier today-was bitten by her cat around 9am today; superficial scratches to chest and shoulder, also pain and swelling to left ring finger for same reason; pt was put on amoxicillin; pt says she has taken one dose; now feeling jittery, nausea and diarrhea; diarrhea x 2; no vomiting; scratches with no redness or swelling on chest and back;

## 2019-07-06 ENCOUNTER — Emergency Department
Admission: EM | Admit: 2019-07-06 | Discharge: 2019-07-06 | Disposition: A | Discharge status: Home / Self Care | Payer: Self-pay | Attending: Emergency Medicine | Admitting: Emergency Medicine

## 2019-07-06 HISTORY — DX: Anxiety disorder, unspecified: F41.9

## 2019-07-06 HISTORY — DX: Other specified cardiac arrhythmias: I49.8

## 2019-07-06 NOTE — ED Notes (Addendum)
Pt up to desk saying she is going to leave; says she already has an appt for followup with her provider in the morning; pt says she will return if anything worsens

## 2019-09-09 ENCOUNTER — Encounter: Payer: Self-pay | Admitting: Emergency Medicine

## 2019-09-09 ENCOUNTER — Emergency Department: Payer: BC Managed Care – PPO

## 2019-09-09 ENCOUNTER — Emergency Department
Admission: EM | Admit: 2019-09-09 | Discharge: 2019-09-09 | Disposition: A | Discharge status: Home / Self Care | Payer: BC Managed Care – PPO | Attending: Student | Admitting: Student

## 2019-09-09 ENCOUNTER — Other Ambulatory Visit: Payer: Self-pay

## 2019-09-09 DIAGNOSIS — S8011XA Contusion of right lower leg, initial encounter: Secondary | ICD-10-CM | POA: Diagnosis not present

## 2019-09-09 DIAGNOSIS — Y929 Unspecified place or not applicable: Secondary | ICD-10-CM | POA: Insufficient documentation

## 2019-09-09 DIAGNOSIS — T148XXA Other injury of unspecified body region, initial encounter: Secondary | ICD-10-CM

## 2019-09-09 DIAGNOSIS — X58XXXA Exposure to other specified factors, initial encounter: Secondary | ICD-10-CM | POA: Insufficient documentation

## 2019-09-09 DIAGNOSIS — Y999 Unspecified external cause status: Secondary | ICD-10-CM | POA: Insufficient documentation

## 2019-09-09 DIAGNOSIS — Y939 Activity, unspecified: Secondary | ICD-10-CM | POA: Diagnosis not present

## 2019-09-09 DIAGNOSIS — M79604 Pain in right leg: Secondary | ICD-10-CM | POA: Diagnosis present

## 2019-09-09 NOTE — Discharge Instructions (Addendum)
Your exam and ultrasound are normal at this time. There is no evidence of a deep vein clot in the leg. You can apply ice and/or moist heat to reduce swelling.

## 2019-09-09 NOTE — ED Triage Notes (Signed)
Patient reports painful swelling, bruising and pain to right leg about 3 weeks ago. Patient states it started as bruising and denies any injury. States as the bruising faded, she began to have more significant pain. Reports now, anytime she is active and on her feet a lot, her leg will swell, especially around her knee.

## 2019-09-09 NOTE — ED Provider Notes (Signed)
[facility] Emergency Department Provider Note ____________________________________________  Time seen: 73  I have reviewed the triage vital signs and the nursing notes.  HISTORY  Chief Complaint  Leg Pain   HPI Suzanne Day is a 29 y.o. female presents as up to the ED for concern over  tenderness and resolving bruising to the inner right thigh.  Patient describes onset of of bruising to the inner thigh about 3 weeks prior.  She is unaware of any traumatic injury or contusion, preceding the onset of bruising.  She reported to a local urgent care a couple days later, and had lab work drawn which was negative and reassuring at the time.  She was advised to apply ice and moist heat and follow-up with a local ED if symptoms persisted.  She admits that her bruising subsequently faded, but she began to have continued pain in the inner thigh.  She also notes an area of superficial tenderness over what appears to be a varicose vein.  She denies any chest pain, shortness of breath, weakness, or syncope.  She denies any history of chronic ongoing knee pain.  She presents now for further evaluation of her symptoms.  Past Medical History:  Diagnosis Date  . Anxiety   . Sinus arrhythmia     There are no problems to display for this patient.   History reviewed. No pertinent surgical history.  Prior to Admission medications   Medication Sig Start Date End Date Taking? Authorizing Provider  amoxicillin (AMOXIL) 875 MG tablet Take 875 mg by mouth 2 (two) times daily.    [provider]    Allergies Patient has no known allergies.  No family history on file.  Social History Social History   Tobacco Use  . Smoking status: Never Smoker  . Smokeless tobacco: Never Used  Substance Use Topics  . Alcohol use: Never  . Drug use: Never    Review of Systems  Constitutional: Negative for fever. Cardiovascular: Negative for chest pain. Respiratory:  Negative for shortness of breath. Musculoskeletal: Negative for back pain. Skin: Negative for rash.  Right inner thigh swelling as above. Neurological: Negative for headaches, focal weakness or numbness. ____________________________________________  PHYSICAL EXAM:  VITAL SIGNS: ED Triage Vitals  Enc Vitals Group     BP 09/09/19 1840 122/72     Pulse Rate 09/09/19 1840 72     Resp 09/09/19 1840 16     Temp 09/09/19 1840 98.2 F (36.8 C)     Temp Source 09/09/19 1840 Oral     SpO2 09/09/19 1840 98 %     Weight 09/09/19 1838 166 lb (75.3 kg)     Height 09/09/19 1838 5\' 3"  (1.6 m)     Head Circumference --      Peak Flow --      Pain Score 09/09/19 1837 6     Pain Loc --      Pain Edu? --      Excl. in [place]? --     Constitutional: Alert and oriented. Well appearing and in no distress. Head: Normocephalic and atraumatic. Eyes: Conjunctivae are normal. Normal extraocular movements Cardiovascular: Normal rate, regular rhythm. Normal distal pulses. Respiratory: Normal respiratory effort. No wheezes/rales/rhonchi. Musculoskeletal: Right lower extremity without any obvious deformity or dislocation.  Right knee exam is overall benign reassuring at this time.  No popliteal space fullness noted.  No internal derangement of the knee is suspected.  Nontender with normal range of motion in all extremities.  Neurologic:  Cranial nerves II to XII grossly intact.  Normal gait without ataxia. Normal speech and language. No gross focal neurologic deficits are appreciated. Skin:  Skin is warm, dry and intact. No rash noted.  Patient is with a mild area to the proximal posterior calf that is tender to palpation.  He also appears to be a small superficial hematoma in the same area.  Inner thigh bruising, photo is currently resolved.  Patient does have a similar area bruising to the unaffected thigh, concerning for possible contusion related to work activities. Psychiatric: Mood and affect are normal.  Patient exhibits appropriate insight and judgment. ____________________________________________   RADIOLOGY  Venous Doppler US RLE  IMPRESSION: No evidence of deep venous thrombosis in the right lower extremity. ____________________________________________  PROCEDURES  Ace bandage Procedures ____________________________________________  INITIAL IMPRESSION / ASSESSMENT AND PLAN / ED COURSE  Patient with ED evaluation and concern of a possible right lower extremity DVT.  Patient presents with what appears to be local bruising following a minor contusion.  Work activities are probably the proximate cause of the patient's initial injury.  She is reassured by her exam and her DVT study at this time.  She is advised to wear an Ace bandage over the inner thigh to protect from further contusion, she is also given instruction to apply ice and/or heat to promote healing.  She understands self-limited course of local hematoma as well as some mild superficial phlebitis.  She will follow-up with primary provider return to the ED if necessary.  [name] was evaluated in Emergency Department on 09/09/2019 for the symptoms described in the history of present illness. She was evaluated in the context of the global COVID-19 pandemic, which necessitated consideration that the patient might be at risk for infection with the SARS-CoV-2 virus that causes COVID-19. Institutional protocols and algorithms that pertain to the evaluation of patients at risk for COVID-19 are in a state of rapid change based on information released by regulatory bodies including the CDC and federal and state organizations. These policies and algorithms were followed during the patient's care in the ED. ____________________________________________  FINAL CLINICAL IMPRESSION(S) / ED DIAGNOSES  Final diagnoses:  Contusion of multiple sites of right lower extremity, initial encounter  Hematoma      Geovanna Simko, Charlesetta Ivory,  PA-C 09/09/19 2121    Miguel Aschoff., MD 09/09/19 [phone]

## 2019-09-16 ENCOUNTER — Emergency Department: Payer: BC Managed Care – PPO

## 2019-09-16 ENCOUNTER — Encounter: Payer: Self-pay | Admitting: Emergency Medicine

## 2019-09-16 ENCOUNTER — Emergency Department
Admission: EM | Admit: 2019-09-16 | Discharge: 2019-09-16 | Disposition: A | Discharge status: Home / Self Care | Payer: BC Managed Care – PPO | Attending: Emergency Medicine | Admitting: Emergency Medicine

## 2019-09-16 ENCOUNTER — Other Ambulatory Visit: Payer: Self-pay

## 2019-09-16 DIAGNOSIS — X58XXXA Exposure to other specified factors, initial encounter: Secondary | ICD-10-CM | POA: Insufficient documentation

## 2019-09-16 DIAGNOSIS — Y999 Unspecified external cause status: Secondary | ICD-10-CM | POA: Insufficient documentation

## 2019-09-16 DIAGNOSIS — S86811A Strain of other muscle(s) and tendon(s) at lower leg level, right leg, initial encounter: Secondary | ICD-10-CM | POA: Diagnosis not present

## 2019-09-16 DIAGNOSIS — Y9289 Other specified places as the place of occurrence of the external cause: Secondary | ICD-10-CM | POA: Insufficient documentation

## 2019-09-16 DIAGNOSIS — M79604 Pain in right leg: Secondary | ICD-10-CM | POA: Diagnosis present

## 2019-09-16 DIAGNOSIS — S86911A Strain of unspecified muscle(s) and tendon(s) at lower leg level, right leg, initial encounter: Secondary | ICD-10-CM

## 2019-09-16 DIAGNOSIS — Y9389 Activity, other specified: Secondary | ICD-10-CM | POA: Diagnosis not present

## 2019-09-16 LAB — CBC WITH DIFFERENTIAL/PLATELET
Abs Immature Granulocytes: 0.01 10*3/uL (ref 0.00–0.07)
Basophils Absolute: 0 10*3/uL (ref 0.0–0.1)
Basophils Relative: 1 %
Eosinophils Absolute: 0.1 10*3/uL (ref 0.0–0.5)
Eosinophils Relative: 1 %
HCT: 36.3 % (ref 36.0–46.0)
Hemoglobin: 12.2 g/dL (ref 12.0–15.0)
Immature Granulocytes: 0 %
Lymphocytes Relative: 33 %
Lymphs Abs: 2.8 10*3/uL (ref 0.7–4.0)
MCH: 27.5 pg (ref 26.0–34.0)
MCHC: 33.6 g/dL (ref 30.0–36.0)
MCV: 81.8 fL (ref 80.0–100.0)
Monocytes Absolute: 0.6 10*3/uL (ref 0.1–1.0)
Monocytes Relative: 8 %
Neutro Abs: 4.9 10*3/uL (ref 1.7–7.7)
Neutrophils Relative %: 57 %
Platelets: 297 10*3/uL (ref 150–400)
RBC: 4.44 MIL/uL (ref 3.87–5.11)
RDW: 13 % (ref 11.5–15.5)
WBC: 8.4 10*3/uL (ref 4.0–10.5)
nRBC: 0 % (ref 0.0–0.2)

## 2019-09-16 LAB — COMPREHENSIVE METABOLIC PANEL
ALT: 11 U/L (ref 0–44)
AST: 18 U/L (ref 15–41)
Albumin: 4.4 g/dL (ref 3.5–5.0)
Alkaline Phosphatase: 45 U/L (ref 38–126)
Anion gap: 11 (ref 5–15)
BUN: 10 mg/dL (ref 6–20)
CO2: 24 mmol/L (ref 22–32)
Calcium: 9.2 mg/dL (ref 8.9–10.3)
Chloride: 104 mmol/L (ref 98–111)
Creatinine, Ser: 0.61 mg/dL (ref 0.44–1.00)
GFR calc Af Amer: >60 mL/min (ref >60)
GFR calc non Af Amer: >60 mL/min (ref >60)
Glucose, Bld: 92 mg/dL (ref 70–99)
Potassium: 3.7 mmol/L (ref 3.5–5.1)
Sodium: 139 mmol/L (ref 135–145)
Total Bilirubin: 0.5 mg/dL (ref 0.3–1.2)
Total Protein: 7.3 g/dL (ref 6.5–8.1)

## 2019-09-16 LAB — PROTIME-INR
INR: 1.1 (ref 0.8–1.2)
Prothrombin Time: 14.1 seconds (ref 11.4–15.2)

## 2019-09-16 MED ORDER — MELOXICAM 15 MG PO TABS: 15.0000 mg | ORAL_TABLET | Freq: Every day | ORAL | 0 refills | Status: DC

## 2019-09-16 MED ORDER — IOHEXOL 300 MG/ML  SOLN
100.0000 mL | Freq: Once | INTRAMUSCULAR | Status: AC | PRN
  Administered 2019-09-16: 100 mL via INTRAVENOUS
  Filled 2019-09-16: qty 100

## 2019-09-16 MED ORDER — MELOXICAM 7.5 MG PO TABS
15.0000 mg | ORAL_TABLET | Freq: Once | ORAL | Status: AC
  Administered 2019-09-16: 15 mg via ORAL
  Filled 2019-09-16: qty 2

## 2019-09-16 NOTE — ED Triage Notes (Signed)
Pt reports seen here for pain to her right leg and had an [place] but is still having pain. Pt called her MD and was told to just come to the ED. Denies re injury, states still hurting from several weeks ago.

## 2019-09-16 NOTE — ED Provider Notes (Signed)
[facility] Emergency Department Provider Note  ____________________________________________  Time seen: Approximately 7:17 PM  I have reviewed the triage vital signs and the nursing notes.   HISTORY  Chief Complaint Leg Pain    HPI Suzanne Day is a 29 y.o. female who presents the emergency department for further evaluation of pain, bruising, edema to the right lower extremity.  Patient was seen in this department a week ago, had a reassuring ultrasound with no evidence of DVT.  Patient had a solitary lesion to the proximal calf, bruising to the calf region.  Patient reports that she is having extending edema into the posterior knee, into the proximal thigh.  She does have underlying ecchymosis to this area.  She is having increasing pain as well.  No trauma to the area.  No history of DVT.  Patient denies any fevers or chills, abdominal pain.  No         Past Medical History:  Diagnosis Date  . Anxiety   . Sinus arrhythmia     There are no problems to display for this patient.   History reviewed. No pertinent surgical history.  Prior to Admission medications   Medication Sig Start Date End Date Taking? Authorizing Provider  amoxicillin (AMOXIL) 875 MG tablet Take 875 mg by mouth 2 (two) times daily.    [provider]  meloxicam (MOBIC) 15 MG tablet Take 1 tablet (15 mg total) by mouth daily. 09/16/19   Mostyn Varnell, Delorise Royals, PA-C    Allergies Patient has no known allergies.  No family history on file.  Social History Social History   Tobacco Use  . Smoking status: Never Smoker  . Smokeless tobacco: Never Used  Substance Use Topics  . Alcohol use: Never  . Drug use: Never     Review of Systems  Constitutional: No fever/chills Eyes: No visual changes. No discharge ENT: No upper respiratory complaints. Cardiovascular: no chest pain. Respiratory: no cough. No SOB. Gastrointestinal: No abdominal pain.  No nausea, no   Musculoskeletal: Positive for pain, swelling extending from the proximal calf into the thigh.  Underlying ecchymosis.  No trauma to the area. Skin: Negative for rash, abrasions, lacerations, ecchymosis. Neurological: Negative for headaches, focal weakness or numbness. 10-point ROS otherwise negative.  ____________________________________________   PHYSICAL EXAM:  VITAL SIGNS: ED Triage Vitals  Enc Vitals Group     BP 09/16/19 1746 124/65     Pulse Rate 09/16/19 1746 65     Resp 09/16/19 1746 16     Temp 09/16/19 1746 98.6 F (37 C)     Temp Source 09/16/19 1746 Oral     SpO2 09/16/19 1746 100 %     Weight 09/16/19 1701 166 lb (75.3 kg)     Height 09/16/19 1701 5\' 3"  (1.6 m)     Head Circumference --      Peak Flow --      Pain Score 09/16/19 1701 8     Pain Loc --      Pain Edu? --      Excl. in [place]? --      Constitutional: Alert and oriented. Well appearing and in no acute distress. Eyes: Conjunctivae are normal. PERRL. EOMI. Head: Atraumatic. ENT:      Ears:       Nose: No congestion/rhinnorhea.      Mouth/Throat: Mucous membranes are moist.  Neck: No stridor.    Cardiovascular: Normal rate, regular rhythm. Normal S1 and S2.  Good peripheral circulation. Respiratory:  Normal respiratory effort without tachypnea or retractions. Lungs CTAB. Good air entry to the bases with no decreased or absent breath sounds. Musculoskeletal: Full range of motion to all extremities. No gross deformities appreciated.  Visualization of the right lower extremity reveals edema extending from the proximal calf, along the popliteal region into the distal thigh.  This is most pronounced along the medial aspect of the leg.  Mild underlying ecchymosis.  Area is very tender to palpation.  No warmth or coolness to the area.  Dorsalis pedis pulse intact distally.  Sensation intact distally. Neurologic:  Normal speech and language. No gross focal neurologic deficits are appreciated.  Skin:  Skin is  warm, dry and intact. No rash noted. Psychiatric: Mood and affect are normal. Speech and behavior are normal. Patient exhibits appropriate insight and judgement.   ____________________________________________   LABS (all labs ordered are listed, but only abnormal results are displayed)  Labs Reviewed  COMPREHENSIVE METABOLIC PANEL  CBC WITH DIFFERENTIAL/PLATELET  PROTIME-INR  POC URINE PREG, ED   ____________________________________________  EKG   ____________________________________________  RADIOLOGY I personally viewed and evaluated these images as part of my medical decision making, as well as reviewing the written report by the radiologist.  CT FEMUR RIGHT W CONTRAST  Result Date: 09/16/2019 CLINICAL DATA:  Question of soft tissue infection pain to the proximal calf EXAM: CT OF THE LOWER RIGHT EXTREMITY WITH CONTRAST TECHNIQUE: Multidetector CT imaging of the lower right extremity was performed according to the standard protocol following intravenous contrast administration. COMPARISON:  None. CONTRAST:  144mL OMNIPAQUE IOHEXOL 300 MG/ML  SOLN FINDINGS: Bones/Joint/Cartilage No fracture or dislocation. Normal bone mineralization seen throughout. Joint spaces are well maintained. No hip or knee joint effusion. Ligaments Suboptimally assessed by CT. However there does appear to be some edema seen within the region of the ACL. The PCL is intact. Muscles and Tendons Normal appearance of the muscles surrounding the lower extremity without evidence of focal tear or atrophy. The tendons appear to be intact. Soft tissues The visualized portion of the deep pelvis is unremarkable. No focal soft tissue swelling or soft tissue fluid collection. IMPRESSION: No acute osseous abnormality. There is partially visualized edema seen within the region of the ACL which could be due to ligamentous injury. If further evaluation is required would recommend MRI. Electronically Signed   By: Prudencio Pair M.D.    On: 09/16/2019 22:50   US Venous Img Lower Unilateral Right  Result Date: 09/16/2019 CLINICAL DATA:  Worsening pain and swelling of right lower extremity EXAM: RIGHT LOWER EXTREMITY VENOUS DOPPLER ULTRASOUND TECHNIQUE: Gray-scale sonography with graded compression, as well as color Doppler and duplex ultrasound were performed to evaluate the lower extremity deep venous systems from the level of the common femoral vein and including the common femoral, femoral, profunda femoral, popliteal and calf veins including the posterior tibial, peroneal and gastrocnemius veins when visible. The superficial great saphenous vein was also interrogated. Spectral Doppler was utilized to evaluate flow at rest and with distal augmentation maneuvers in the common femoral, femoral and popliteal veins. COMPARISON:  09/09/2019 FINDINGS: Contralateral Common Femoral Vein: Respiratory phasicity is normal and symmetric with the symptomatic side. No evidence of thrombus. Normal compressibility. Common Femoral Vein: No evidence of thrombus. Normal compressibility, respiratory phasicity and response to augmentation. Saphenofemoral Junction: No evidence of thrombus. Normal compressibility and flow on color Doppler imaging. Profunda Femoral Vein: No evidence of thrombus. Normal compressibility and flow on color Doppler imaging. Femoral Vein: No evidence of thrombus.  Normal compressibility, respiratory phasicity and response to augmentation. Popliteal Vein: No evidence of thrombus. Normal compressibility, respiratory phasicity and response to augmentation. Calf Veins: No evidence of thrombus. Normal compressibility and flow on color Doppler imaging. Superficial Great Saphenous Vein: No evidence of thrombus. Normal compressibility. Venous Reflux:  None. Other Findings:  None. IMPRESSION: No evidence of deep venous thrombosis. Electronically Signed   By: Charlett Nose M.D.   On: 09/16/2019 20:07     ____________________________________________    PROCEDURES  Procedure(s) performed:    Procedures    Medications  meloxicam (MOBIC) tablet 15 mg (has no administration in time range)  iohexol (OMNIPAQUE) 300 MG/ML solution 100 mL (100 mLs Intravenous Contrast Given 09/16/19 2221)     ____________________________________________   INITIAL IMPRESSION / ASSESSMENT AND PLAN / ED COURSE  Pertinent labs & imaging results that were available during my care of the patient were reviewed by me and considered in my medical decision making (see chart for details).  Review of the [place] CSRS was performed in accordance of the [facility] prior to dispensing any controlled drugs.           Patient's diagnosis is consistent with knee strain.  Patient presented to emergency department complaining of right leg pain, ecchymosis, edema.  Patient had been evaluated a week ago with bruising, pain to this region.  Area of edema which is much smaller and ultrasound revealed no evidence of DVT.  Patient states that she is continue to do her normal work activities, but the area to her leg has been increasing both with edema, and pain.  Ecchymosis was improving however.  Repeat ultrasound reveals no DVT.  CT was ordered given the increase in soft tissue edema.  No evidence of fluid-filled collection concerning for hematoma or abscess.  Patient does have edema originating around the ACL.  On my discussion with the patient of CT findings, she does report that playing sports in high school she sustained an injury with an ACL injury as well as a nondisplaced tibial plateau fracture.  This was treated conservatively at the time did not require surgery.  Patient reports that with her strenuous job with heavy lifting, twisting with heavy lifting she may have injured it but she does still does not remember a precipitating injury.  As such patient will be placed in a knee immobilizer, given anti-inflammatories and referred  to orthopedics.  Return precautions discussed with the patient.  Patient will follow up with orthopedics as described above.. Patient is given ED precautions to return to the ED for any worsening or new symptoms.     ____________________________________________  FINAL CLINICAL IMPRESSION(S) / ED DIAGNOSES  Final diagnoses:  Strain of right knee, initial encounter      NEW MEDICATIONS STARTED DURING THIS VISIT:  ED Discharge Orders         Ordered    meloxicam (MOBIC) 15 MG tablet  Daily     09/16/19 2316              This chart was dictated using voice recognition software/Dragon. Despite best efforts to proofread, errors can occur which can change the meaning. Any change was purely unintentional.    Racheal Patches, PA-C 09/16/19 2316    Shaune Pollack, MD 09/17/19 1259

## 2019-09-18 ENCOUNTER — Encounter: Payer: Self-pay | Admitting: Emergency Medicine

## 2021-03-10 ENCOUNTER — Encounter: Payer: Self-pay | Admitting: Emergency Medicine

## 2021-03-10 ENCOUNTER — Other Ambulatory Visit: Payer: Self-pay

## 2021-03-10 ENCOUNTER — Emergency Department
Admission: EM | Admit: 2021-03-10 | Discharge: 2021-03-10 | Disposition: A | Discharge status: Home / Self Care | Payer: Self-pay | Attending: Emergency Medicine | Admitting: Emergency Medicine

## 2021-03-10 ENCOUNTER — Ambulatory Visit: Payer: Self-pay | Attending: Internal Medicine

## 2021-03-10 DIAGNOSIS — T50Z95A Adverse effect of other vaccines and biological substances, initial encounter: Secondary | ICD-10-CM | POA: Insufficient documentation

## 2021-03-10 DIAGNOSIS — L299 Pruritus, unspecified: Secondary | ICD-10-CM | POA: Insufficient documentation

## 2021-03-10 DIAGNOSIS — R5383 Other fatigue: Secondary | ICD-10-CM | POA: Insufficient documentation

## 2021-03-10 DIAGNOSIS — Z23 Encounter for immunization: Secondary | ICD-10-CM

## 2021-03-10 DIAGNOSIS — R251 Tremor, unspecified: Secondary | ICD-10-CM | POA: Insufficient documentation

## 2021-03-10 DIAGNOSIS — M791 Myalgia, unspecified site: Secondary | ICD-10-CM | POA: Insufficient documentation

## 2021-03-10 DIAGNOSIS — T50B95A Adverse effect of other viral vaccines, initial encounter: Secondary | ICD-10-CM

## 2021-03-10 MED ORDER — ONDANSETRON 4 MG PO TBDP: 4.0000 mg | ORAL_TABLET | Freq: Three times a day (TID) | ORAL | 0 refills | Status: DC | PRN

## 2021-03-10 NOTE — ED Triage Notes (Signed)
Pt presents to ER from home via EMS with complaints of left arm pain, "shaking since 7pm" pt reports she received COVID vaccine first dose around 3 pm reports developed left arm pain, feeling tired. Pt talks in complete sentences no distress noted

## 2021-03-10 NOTE — ED Provider Notes (Signed)
[facility] Emergency Department Provider Note  ____________________________________________  Time seen: Approximately 9:31 PM  I have reviewed the triage vital signs and the nursing notes.   HISTORY  Chief Complaint Fatigue   HPI Suzanne Day is a 30 y.o. female presents emergency department for concern of adverse effect after receiving COVID-19 vaccination.  She received the first vaccination today around 3:00.  Shortly after, she noticed her eyelids were itching and she had some itching sensation in the back of her throat but never developed any sensation of tongue or throat swelling.  Around 730 this evening, she states that she began to have a "shaking episode" and has been exhausted with body aches since around that time.  Past Medical History:  Diagnosis Date   Anxiety    Sinus arrhythmia     There are no problems to display for this patient.   History reviewed. No pertinent surgical history.  Prior to Admission medications   Medication Sig Start Date End Date Taking? Authorizing Provider  ondansetron (ZOFRAN-ODT) 4 MG disintegrating tablet Take 1 tablet (4 mg total) by mouth every 8 (eight) hours as needed for nausea or vomiting. 03/10/21  Yes Becca Bayne B, FNP  amoxicillin (AMOXIL) 500 MG tablet Take 1 tablet (500 mg total) by mouth 2 (two) times daily. 05/08/15   Beers, Charmayne Sheer, PA-C  amoxicillin (AMOXIL) 875 MG tablet Take 875 mg by mouth 2 (two) times daily.    [provider]  cyclobenzaprine (FLEXERIL) 5 MG tablet Take 1 tablet (5 mg total) by mouth every 8 (eight) hours as needed for muscle spasms. 01/27/16   Menshew, Charlesetta Ivory, PA-C  HYDROcodone-acetaminophen (NORCO/VICODIN) 5-325 MG tablet Take 1 tablet by mouth every 4 (four) hours as needed. 09/23/16   Minna Antis, MD  ibuprofen (ADVIL,MOTRIN) 800 MG tablet Take 1 tablet (800 mg total) by mouth every 8 (eight) hours as needed. 05/08/15   Beers, Charmayne Sheer, PA-C   lidocaine (XYLOCAINE) 2 % solution Use as directed 20 mLs in the mouth or throat as needed for mouth pain. 05/08/15   Beers, Charmayne Sheer, PA-C  meloxicam (MOBIC) 15 MG tablet Take 1 tablet (15 mg total) by mouth daily. 09/16/19   Cuthriell, Delorise Royals, PA-C    Allergies Patient has no known allergies.  No family history on file.  Social History Social History   Tobacco Use   Smoking status: Never   Smokeless tobacco: Never  Substance Use Topics   Alcohol use: Never    Comment: occ   Drug use: Never    Types: Cocaine    Review of Systems Constitutional: Negative for fever. ENT: Negative for sore throat. Respiratory: Negative for cough Gastrointestinal: No abdominal pain.  No nausea, no vomiting.  No diarrhea.  Musculoskeletal: Positive for generalized body aches. Skin: Negative for rash/lesion/wound. Neurological: Negative for headaches, focal weakness or numbness.  ____________________________________________   PHYSICAL EXAM:  VITAL SIGNS: ED Triage Vitals [03/10/21 2041]  Enc Vitals Group     BP 117/76     Pulse Rate 75     Resp 16     Temp 99.3 F (37.4 C)     Temp Source Oral     SpO2 98 %     Weight 170 lb (77.1 kg)     Height 5\' 4"  (1.626 m)     Head Circumference      Peak Flow      Pain Score 6     Pain Loc  Pain Edu?      Excl. in [place]?     Constitutional: Alert and oriented. Well appearing and in no acute distress. Eyes: Conjunctivae are normal.  Head: Atraumatic. Nose: No congestion/rhinnorhea. Mouth/Throat: Mucous membranes are moist.  Airways patent.  No evidence of edema Neck: No stridor.  Cardiovascular: Normal rate, regular rhythm. Good peripheral circulation. Respiratory: Normal respiratory effort. Musculoskeletal: Full ROM throughout.  Neurologic:  Normal speech and language. No gross focal neurologic deficits are appreciated. Speech is normal. No gait instability. Skin:  Skin is warm, dry and intact. No rash noted. Psychiatric: Mood  and affect are normal. Speech and behavior are normal.  ____________________________________________   LABS (all labs ordered are listed, but only abnormal results are displayed)  Labs Reviewed - No data to display ____________________________________________  EKG  Not indicated ____________________________________________  RADIOLOGY  Not indicated ____________________________________________   PROCEDURES  Not indicated ____________________________________________   INITIAL IMPRESSION / ASSESSMENT AND PLAN / ED COURSE     Pertinent labs & imaging results that were available during my care of the patient were reviewed by me and considered in my medical decision making (see chart for details).  Patient advised not to take the second vaccination.    Patient was advised to take Benadryl if needed.  She was advised to return to the emergency department for symptoms of concern.. ____________________________________________   FINAL CLINICAL IMPRESSION(S) / ED DIAGNOSES  Final diagnoses:  Fatigue after COVID-19 vaccination       Chinita Pester, FNP 03/10/21 2355    Chesley Noon, MD 03/14/21 [phone]

## 2021-03-10 NOTE — ED Notes (Signed)
First nurse note: Pt to ED via EMS from home c/o left arm soreness and shakiness today.  EMS states first pfizer covid shot today, VSS, NKA.

## 2021-03-10 NOTE — Progress Notes (Signed)
   Covid-19 Vaccination Clinic  Name:  SANIYYA GAU    MRN: [id number] DOB: June 22, 1991  03/10/2021  Ms. Cathell was observed post Covid-19 immunization for 15 minutes without incident. She was provided with Vaccine Information Sheet and instruction to access the V-Safe system.   Ms. Racanelli was instructed to call 911 with any severe reactions post vaccine: Difficulty breathing  Swelling of face and throat  A fast heartbeat  A bad rash all over body  Dizziness and weakness   Immunizations Administered     Name Date Dose VIS Date Route   PFIZER Comrnaty(Gray TOP) Covid-19 Vaccine 03/10/2021  2:49 PM 0.3 mL 08/11/2020 Intramuscular   Manufacturer: [employer], [employer]   Lot: [id number]   NDC: [address]-1      Drusilla Kanner, PharmD, MBA Clinical Acute Care Pharmacist

## 2021-03-10 NOTE — ED Notes (Signed)
Patient states that she received her first Covid vaccine about 15:00 today. Patient states that around 19:00 she started feeling tired and shaky. Patient also reports pain to her left arm that she states is from the vaccine.

## 2021-03-14 ENCOUNTER — Other Ambulatory Visit: Payer: Self-pay

## 2021-03-14 MED ORDER — COVID-19 MRNA VAC-TRIS(PFIZER) 30 MCG/0.3ML IM SUSP
INTRAMUSCULAR | 0 refills | Status: DC
Start: 2021-03-10 — End: 2021-04-10
  Filled 2021-03-14: qty 0.3, 30d supply, fill #0

## 2021-03-31 ENCOUNTER — Ambulatory Visit: Payer: Self-pay

## 2021-04-10 ENCOUNTER — Ambulatory Visit
Admission: EM | Admit: 2021-04-10 | Discharge: 2021-04-10 | Disposition: A | Discharge status: Home / Self Care | Payer: BC Managed Care – PPO | Attending: Family Medicine | Admitting: Family Medicine

## 2021-04-10 ENCOUNTER — Other Ambulatory Visit: Payer: Self-pay

## 2021-04-10 ENCOUNTER — Ambulatory Visit: Admission: EM | Admit: 2021-04-10 | Payer: Self-pay

## 2021-04-10 DIAGNOSIS — R519 Headache, unspecified: Secondary | ICD-10-CM

## 2021-04-10 DIAGNOSIS — R21 Rash and other nonspecific skin eruption: Secondary | ICD-10-CM | POA: Diagnosis not present

## 2021-04-10 DIAGNOSIS — R079 Chest pain, unspecified: Secondary | ICD-10-CM | POA: Diagnosis not present

## 2021-04-10 DIAGNOSIS — R0789 Other chest pain: Secondary | ICD-10-CM | POA: Diagnosis not present

## 2021-04-10 HISTORY — DX: Migraine, unspecified, not intractable, without status migrainosus: G43.909

## 2021-04-10 MED ORDER — TRIAMCINOLONE ACETONIDE 0.1 % EX OINT: 1.0000 "application " | TOPICAL_OINTMENT | Freq: Two times a day (BID) | CUTANEOUS | 0 refills | Status: AC

## 2021-04-10 MED ORDER — PROMETHAZINE HCL 25 MG PO TABS: 25.0000 mg | ORAL_TABLET | Freq: Three times a day (TID) | ORAL | 0 refills | Status: AC | PRN

## 2021-04-10 NOTE — ED Triage Notes (Signed)
Pt c/o headache and fatigue for several days. Pt also reports diarrhea, nausea and one bout of emesis earlier today. Pt also reports small red bumps that itch, several around her anal area that are painful. Pt also reports left-sided chest pain since Friday, constant sharp pain.

## 2021-04-10 NOTE — Discharge Instructions (Addendum)
Your exam and EKG were reassuring.  I believe that you are having migraine headaches. Use the phenergan and imitrex as directed.  Use the prescribed topical steroid for your rash.  Follow up with your PCP.  Take care  Dr. Adriana Simas

## 2021-04-11 NOTE — ED Provider Notes (Signed)
[facility]    CSN: [id number] Arrival date & time: 04/10/21  1302  History   Chief Complaint Chief Complaint  Patient presents with   Chest Pain   Rash    HPI 30 year old female presents with multiple complaints.  Patient reports that she has not been feeling well since Wednesday.  She states that she has had headache, fatigue, nausea, vomiting.  She has a history of migraine headaches.  She states that her headache has been on the left side.  Patient reports that her last bout of emesis was this morning.  Patient also reports that she has had ongoing chest pain since Friday.  Sharp pain.  Located on left side of the chest.  No reports of shortness of breath.  Additionally, patient reports that she has some "bumps" on her right arm.  She states that she has also had a bump around her anus that she recently drained.  No fever.  She rates her pain as 5/10 in severity at this time.  No other associated symptoms.  No other complaints.  Past Medical History:  Diagnosis Date   Anxiety    Migraine headache    Sinus arrhythmia     Home Medications    Prior to Admission medications   Medication Sig Start Date End Date Taking? Authorizing Provider  Magnesium Oxide 250 MG TABS magnesium 250 mg (as magnesium oxide) tablet  TAKE 1 TABLET BY MOUTH EVERY DAY 11/21/20  Yes [provider]  ondansetron (ZOFRAN-ODT) 4 MG disintegrating tablet Take 1 tablet (4 mg total) by mouth every 8 (eight) hours as needed for nausea or vomiting. 03/10/21  Yes Triplett, Cari B, FNP  promethazine (PHENERGAN) 25 MG tablet Take 1 tablet (25 mg total) by mouth every 8 (eight) hours as needed for nausea, vomiting or refractory nausea / vomiting. 04/10/21  Yes Ivonna Kinnick G, DO  SUMAtriptan (IMITREX) 50 MG tablet sumatriptan 50 mg tablet  PLEASE SEE ATTACHED FOR DETAILED DIRECTIONS   Yes [provider]  topiramate (TOPAMAX) 25 MG tablet Take 25 mg by mouth 2 (two) times daily. 11/21/20  Yes  [provider]  triamcinolone ointment (KENALOG) 0.1 % Apply 1 application topically 2 (two) times daily. 04/10/21  Yes Tommie Sams, DO    Family History History reviewed. No pertinent family history.  Social History Social History   Tobacco Use   Smoking status: Never   Smokeless tobacco: Never  Vaping Use   Vaping Use: Never used  Substance Use Topics   Alcohol use: Never    Comment: occ   Drug use: Never    Types: Cocaine     Allergies   Bee venom, Covid-19 (mrna) vaccine, and Cashew nut (anacardium occidentale) skin test   Review of Systems Review of Systems   Physical Exam Triage Vital Signs ED Triage Vitals  Enc Vitals Group     BP 04/10/21 1405 112/80     Pulse Rate 04/10/21 1405 75     Resp 04/10/21 1405 18     Temp 04/10/21 1405 98.5 F (36.9 C)     Temp Source 04/10/21 1405 Oral     SpO2 04/10/21 1405 100 %     Weight 04/10/21 1401 173 lb (78.5 kg)     Height 04/10/21 1401 5\' 4"  (1.626 m)     Head Circumference --      Peak Flow --      Pain Score 04/10/21 1401 5     Pain Loc --  Pain Edu? --      Excl. in GC? --    No data found.  Updated Vital Signs BP 112/80 (BP Location: Left Arm)   Pulse 75   Temp 98.5 F (36.9 C) (Oral)   Resp 18   Ht 5\' 4"  (1.626 m)   Wt 78.5 kg   LMP 03/22/2021 (Approximate)   SpO2 100%   BMI 29.70 kg/m   Visual Acuity Right Eye Distance:   Left Eye Distance:   Bilateral Distance:    Right Eye Near:   Left Eye Near:    Bilateral Near:     Physical Exam Vitals and nursing note reviewed. Exam conducted with a chaperone present.  Constitutional:      General: She is not in acute distress.    Appearance: Normal appearance. She is not ill-appearing.  HENT:     Head: Normocephalic and atraumatic.  Eyes:     General:        Right eye: No discharge.        Left eye: No discharge.     Conjunctiva/sclera: Conjunctivae normal.  Cardiovascular:     Rate and Rhythm: Normal rate and regular  rhythm.  Pulmonary:     Effort: Pulmonary effort is normal.     Breath sounds: Normal breath sounds. No wheezing or rales.  Genitourinary:    Rectum: Normal.  Skin:    Comments: Right forearm - 2 small red papules noted.   Neurological:     Mental Status: She is alert.  Psychiatric:        Mood and Affect: Mood normal.        Behavior: Behavior normal.     UC Treatments / Results  Labs (all labs ordered are listed, but only abnormal results are displayed) Labs Reviewed - No data to display  EKG Interpretation: Normal sinus rhythm with rate of 68.  Normal axis.  Normal intervals.  No ST or T wave changes.  Normal EKG.  Radiology No results found.  Procedures Procedures (including critical care time)  Medications Ordered in UC Medications - No data to display  Initial Impression / Assessment and Plan / UC Course  I have reviewed the triage vital signs and the nursing notes.  Pertinent labs & imaging results that were available during my care of the patient were reviewed by me and considered in my medical decision making (see chart for details).    30 year old female presents with multiple complaints.  I believe that the patient is experiencing migraine headaches with associated nausea and vomiting.  Advised to use the prescribed Phenergan as needed.  Use her home Imitrex as needed.  Regarding her chest pain, and the feel that this is musculoskeletal in origin.  Her EKG is normal.  Lastly, her rash is nonspecific.  She has 2 small red papules on exam.  Triamcinolone as directed.  Final Clinical Impressions(s) / UC Diagnoses   Final diagnoses:  Acute nonintractable headache, unspecified headache type  Atypical chest pain  Rash and nonspecific skin eruption     Discharge Instructions      Your exam and EKG were reassuring.  I believe that you are having migraine headaches. Use the phenergan and imitrex as directed.  Use the prescribed topical steroid for your  rash.  Follow up with your PCP.  Take care  Dr. 37    ED Prescriptions     Medication Sig Dispense Auth. Provider   triamcinolone ointment (KENALOG) 0.1 % Apply 1 application topically  2 (two) times daily. 30 g Theresia Pree G, DO   promethazine (PHENERGAN) 25 MG tablet Take 1 tablet (25 mg total) by mouth every 8 (eight) hours as needed for nausea, vomiting or refractory nausea / vomiting. 30 tablet Tommie Sams, DO      PDMP not reviewed this encounter.   Everlene Other [place], [place] 04/11/21 [phone]

## 2021-04-14 ENCOUNTER — Other Ambulatory Visit: Payer: Self-pay

## 2021-04-14 ENCOUNTER — Emergency Department
Admission: EM | Admit: 2021-04-14 | Discharge: 2021-04-14 | Disposition: A | Discharge status: Home / Self Care | Payer: BC Managed Care – PPO | Attending: Emergency Medicine | Admitting: Emergency Medicine

## 2021-04-14 ENCOUNTER — Emergency Department: Payer: BC Managed Care – PPO

## 2021-04-14 ENCOUNTER — Encounter: Payer: Self-pay | Admitting: Emergency Medicine

## 2021-04-14 DIAGNOSIS — R632 Polyphagia: Secondary | ICD-10-CM | POA: Insufficient documentation

## 2021-04-14 DIAGNOSIS — R112 Nausea with vomiting, unspecified: Secondary | ICD-10-CM | POA: Diagnosis not present

## 2021-04-14 DIAGNOSIS — R519 Headache, unspecified: Secondary | ICD-10-CM | POA: Insufficient documentation

## 2021-04-14 LAB — CBG MONITORING, ED: Glucose-Capillary: 102 mg/dL — ABNORMAL HIGH (ref 70–99)

## 2021-04-14 LAB — LIPASE, BLOOD: Lipase: 30 U/L (ref 11–51)

## 2021-04-14 LAB — POC URINE PREG, ED: Preg Test, Ur: NEGATIVE

## 2021-04-14 LAB — URINALYSIS, COMPLETE (UACMP) WITH MICROSCOPIC
Bacteria, UA: NONE SEEN
Bilirubin Urine: NEGATIVE
Glucose, UA: NEGATIVE mg/dL
Hgb urine dipstick: NEGATIVE
Ketones, ur: NEGATIVE mg/dL
Nitrite: NEGATIVE
Protein, ur: NEGATIVE mg/dL
Specific Gravity, Urine: 1.001 — ABNORMAL LOW (ref 1.005–1.030)
pH: 7 (ref 5.0–8.0)

## 2021-04-14 LAB — CBC
HCT: 38.4 % (ref 36.0–46.0)
Hemoglobin: 12.9 g/dL (ref 12.0–15.0)
MCH: 28.2 pg (ref 26.0–34.0)
MCHC: 33.6 g/dL (ref 30.0–36.0)
MCV: 83.8 fL (ref 80.0–100.0)
Platelets: 322 10*3/uL (ref 150–400)
RBC: 4.58 MIL/uL (ref 3.87–5.11)
RDW: 13.1 % (ref 11.5–15.5)
WBC: 8.7 10*3/uL (ref 4.0–10.5)
nRBC: 0 % (ref 0.0–0.2)

## 2021-04-14 LAB — COMPREHENSIVE METABOLIC PANEL
ALT: 11 U/L (ref 0–44)
AST: 18 U/L (ref 15–41)
Albumin: 4.1 g/dL (ref 3.5–5.0)
Alkaline Phosphatase: 56 U/L (ref 38–126)
Anion gap: 8 (ref 5–15)
BUN: 8 mg/dL (ref 6–20)
CO2: 27 mmol/L (ref 22–32)
Calcium: 9.2 mg/dL (ref 8.9–10.3)
Chloride: 106 mmol/L (ref 98–111)
Creatinine, Ser: 0.64 mg/dL (ref 0.44–1.00)
GFR, Estimated: >60 mL/min (ref >60)
Glucose, Bld: 95 mg/dL (ref 70–99)
Potassium: 4.1 mmol/L (ref 3.5–5.1)
Sodium: 141 mmol/L (ref 135–145)
Total Bilirubin: 0.5 mg/dL (ref 0.3–1.2)
Total Protein: 7.4 g/dL (ref 6.5–8.1)

## 2021-04-14 LAB — TSH: TSH: 1.085 u[IU]/mL (ref 0.350–4.500)

## 2021-04-14 MED ORDER — METOCLOPRAMIDE HCL 10 MG PO TABS: 10.0000 mg | ORAL_TABLET | Freq: Three times a day (TID) | ORAL | 1 refills | Status: AC

## 2021-04-14 MED ORDER — ASPIRIN-ACETAMINOPHEN-CAFFEINE 250-250-65 MG PO TABS
2.0000 | ORAL_TABLET | Freq: Once | ORAL | Status: DC
  Filled 2021-04-14: qty 2

## 2021-04-14 NOTE — ED Triage Notes (Addendum)
Pt comes into the ED via POV c/o emesis and headache.  Pt states she has been diagnosed with chronic migraines, but she feels like this is different.  Pt states the headaches have been ongoing since last Wednesday.  Pt also c/o of constantly being hungry, increased urination, and she is also having the vomiting.

## 2021-04-14 NOTE — Discharge Instructions (Addendum)
Follow up with primary care.  Take the reglan as prescribed.  Return to the ER for symptoms that change or worsen if unable to see primary care.

## 2021-04-14 NOTE — ED Notes (Signed)
See triage note, pt reports that she has been having intermittent headaches since Wednesday, denies headache at this time. Reports extreme hunger and thirst with fatigue. NAd noted

## 2021-04-14 NOTE — ED Provider Notes (Signed)
[facility] Emergency Department Provider Note ____________________________________________   Event Date/Time   First MD Initiated Contact with Patient 04/14/21 1433     (approximate)  I have reviewed the triage vital signs and the nursing notes.   HISTORY  Chief Complaint Headache and Emesis  HPI Suzanne Day is a 30 y.o. female with history of migraine and anxiety presents to the emergency department for treatment and evaluation of feeling constantly hungry. She has also had several headache days with nausea and vomiting. She denies headache currently and last vomited 4 days ago. She does feel nauseated but also has a sensation of staying hungry.       Past Medical History:  Diagnosis Date   Anxiety    Migraine headache    Sinus arrhythmia     There are no problems to display for this patient.   History reviewed. No pertinent surgical history.  Prior to Admission medications   Medication Sig Start Date End Date Taking? Authorizing Provider  metoCLOPramide (REGLAN) 10 MG tablet Take 1 tablet (10 mg total) by mouth 3 (three) times daily with meals. 04/14/21 04/14/22 Yes Guiselle Mian B, FNP  Magnesium Oxide 250 MG TABS magnesium 250 mg (as magnesium oxide) tablet  TAKE 1 TABLET BY MOUTH EVERY DAY 11/21/20   [provider]  ondansetron (ZOFRAN-ODT) 4 MG disintegrating tablet Take 1 tablet (4 mg total) by mouth every 8 (eight) hours as needed for nausea or vomiting. 03/10/21   Xavion Muscat, Kasandra Knudsen, FNP  promethazine (PHENERGAN) 25 MG tablet Take 1 tablet (25 mg total) by mouth every 8 (eight) hours as needed for nausea, vomiting or refractory nausea / vomiting. 04/10/21   Tommie Sams, DO  SUMAtriptan (IMITREX) 50 MG tablet sumatriptan 50 mg tablet  PLEASE SEE ATTACHED FOR DETAILED DIRECTIONS    [provider]  topiramate (TOPAMAX) 25 MG tablet Take 25 mg by mouth 2 (two) times daily. 11/21/20   [provider]  triamcinolone  ointment (KENALOG) 0.1 % Apply 1 application topically 2 (two) times daily. 04/10/21   Tommie Sams, DO    Allergies Bee venom, Covid-19 (mrna) vaccine, and Cashew nut (anacardium occidentale) skin test  History reviewed. No pertinent family history.  Social History Social History   Tobacco Use   Smoking status: Never   Smokeless tobacco: Never  Vaping Use   Vaping Use: Never used  Substance Use Topics   Alcohol use: Never    Comment: occ   Drug use: Never    Types: Cocaine    Review of Systems  Constitutional: No fever/chills Eyes: No visual changes. ENT: No sore throat. Cardiovascular: Denies chest pain. Respiratory: Denies shortness of breath. Gastrointestinal: No abdominal pain. Positive for nausea. No diarrhea.  No constipation. Genitourinary: Negative for dysuria. Musculoskeletal: Negative for back pain. Skin: Negative for rash. Neurological: Positive for headaches, focal weakness or numbness.  ____________________________________________   PHYSICAL EXAM:  VITAL SIGNS: ED Triage Vitals  Enc Vitals Group     BP 04/14/21 1333 (!) 140/99     Pulse Rate 04/14/21 1333 83     Resp 04/14/21 1333 17     Temp 04/14/21 1333 98.5 F (36.9 C)     Temp Source 04/14/21 1333 Oral     SpO2 04/14/21 1333 100 %     Weight 04/14/21 1334 172 lb 15.9 oz (78.5 kg)     Height 04/14/21 1334 5\' 4"  (1.626 m)     Head Circumference --  Peak Flow --      Pain Score 04/14/21 1334 0     Pain Loc --      Pain Edu? --      Excl. in GC? --     Constitutional: Alert and oriented. Well appearing and in no acute distress. Eyes: Conjunctivae are normal. PERRL. EOMI. Head: Atraumatic. Nose: No congestion/rhinnorhea. Mouth/Throat: Mucous membranes are moist.  Oropharynx non-erythematous. Neck: No stridor.   Hematological/Lymphatic/Immunilogical: No cervical lymphadenopathy. Cardiovascular: Normal rate, regular rhythm. Grossly normal heart sounds.  Good peripheral  circulation. Respiratory: Normal respiratory effort.  No retractions. Lungs CTAB. Gastrointestinal: Soft and nontender. No distention. No abdominal bruits.  Musculoskeletal: No lower extremity tenderness nor edema.  No joint effusions. Neurologic:  Normal speech and language. No gross focal neurologic deficits are appreciated. No gait instability. Skin:  Skin is warm, dry and intact. No rash noted. Psychiatric: Mood and affect are normal. Speech and behavior are normal.  ____________________________________________   LABS (all labs ordered are listed, but only abnormal results are displayed)  Labs Reviewed  URINALYSIS, COMPLETE (UACMP) WITH MICROSCOPIC - Abnormal; Notable for the following components:      Result Value   Color, Urine COLORLESS (*)    APPearance CLEAR (*)    Specific Gravity, Urine 1.001 (*)    Leukocytes,Ua TRACE (*)    All other components within normal limits  CBG MONITORING, ED - Abnormal; Notable for the following components:   Glucose-Capillary 102 (*)    All other components within normal limits  LIPASE, BLOOD  COMPREHENSIVE METABOLIC PANEL  CBC  TSH  POC URINE PREG, ED   ____________________________________________  EKG  Not indicated. ____________________________________________  RADIOLOGY  ED MD interpretation:    30 year old female presents to the emergency department for treatment and evaluation of feeling hungry but also nauseated and several headache days over the week that have been different than her typical migraines.   Exam is overall reassuring.   I, Kem Boroughs, personally viewed and evaluated these images (plain radiographs) as part of my medical decision making, as well as reviewing the written report by the radiologist.  Official radiology report(s): CT HEAD WO CONTRAST ( )  Result Date: 04/14/2021 CLINICAL DATA:  Headache EXAM: CT HEAD WITHOUT CONTRAST TECHNIQUE: Contiguous axial images were obtained from the base of the  skull through the vertex without intravenous contrast. COMPARISON:  03/29/2011 FINDINGS: Brain: No evidence of acute infarction, hemorrhage, hydrocephalus, extra-axial collection or mass lesion/mass effect. Vascular: No hyperdense vessel or unexpected calcification. Skull: Normal. Negative for fracture or focal lesion. Sinuses/Orbits: No acute finding. Other: None. IMPRESSION: No acute intracranial process. No etiology seen for the patient's headaches. Electronically Signed   By: Wiliam Ke M.D.   On: 04/14/2021 15:44    ____________________________________________   PROCEDURES  Procedure(s) performed (including Critical Care):  Procedures  ____________________________________________   INITIAL IMPRESSION / ASSESSMENT AND PLAN     30 year old female presenting with complaints as listed in the HPI.  Appears that she has had visits to several healthcare providers in the recent weeks with complaint of headache and nausea with increased appetite.  Plan will be to get labs and head CT.  CT and labs including TSH, glucose, and urinalysis are all overall reassuring.  She discharged home to follow-up with her primary care provider.  For her nausea and change in appetite she will be given a prescription for Reglan.  She was encouraged to take Excedrin migraine if headache returns.   ___________________________________________  FINAL CLINICAL IMPRESSION(S) / ED DIAGNOSES  Final diagnoses:  Polyphagia  Nonintractable headache, unspecified chronicity pattern, unspecified headache type     ED Discharge Orders          Ordered    metoCLOPramide (REGLAN) 10 MG tablet  3 times daily with meals        04/14/21 1633             Suzanne Day was evaluated in Emergency Department on 04/14/2021 for the symptoms described in the history of present illness. She was evaluated in the context of the global COVID-19 pandemic, which necessitated consideration that the patient might be at  risk for infection with the SARS-CoV-2 virus that causes COVID-19. Institutional protocols and algorithms that pertain to the evaluation of patients at risk for COVID-19 are in a state of rapid change based on information released by regulatory bodies including the CDC and federal and state organizations. These policies and algorithms were followed during the patient's care in the ED.   Note:  This document was prepared using Dragon voice recognition software and may include unintentional dictation errors.    Chinita Pester, FNP 04/14/21 1659    [place], MD 04/14/21 [phone]

## 2021-08-08 ENCOUNTER — Emergency Department
Admission: EM | Admit: 2021-08-08 | Discharge: 2021-08-08 | Disposition: A | Discharge status: Home / Self Care | Payer: BC Managed Care – PPO | Attending: Emergency Medicine | Admitting: Emergency Medicine

## 2021-08-08 ENCOUNTER — Other Ambulatory Visit: Payer: Self-pay

## 2021-08-08 DIAGNOSIS — N644 Mastodynia: Secondary | ICD-10-CM | POA: Insufficient documentation

## 2021-08-08 IMAGING — US US EXTREM LOW VENOUS*R*
1 series · 13 of 24 positions shown · non-contrast
Comparison: 09/09/2019

CLINICAL DATA: Worsening pain and swelling of right lower extremity



[Series 1: us extrem low venous*right* · 13 of 33 slices shown]
[im 1/33]
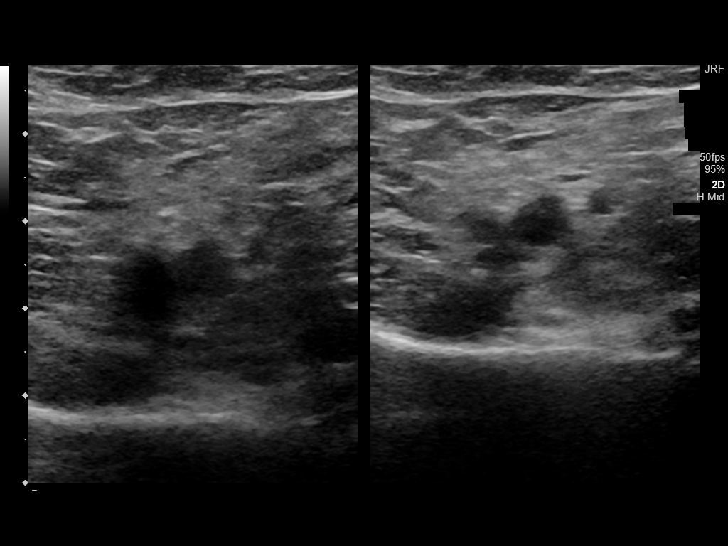
[im 3/33]
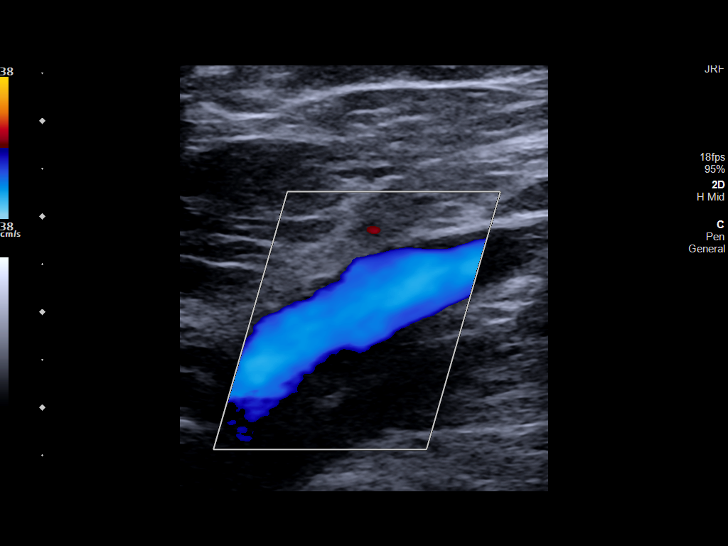
[im 6/33]
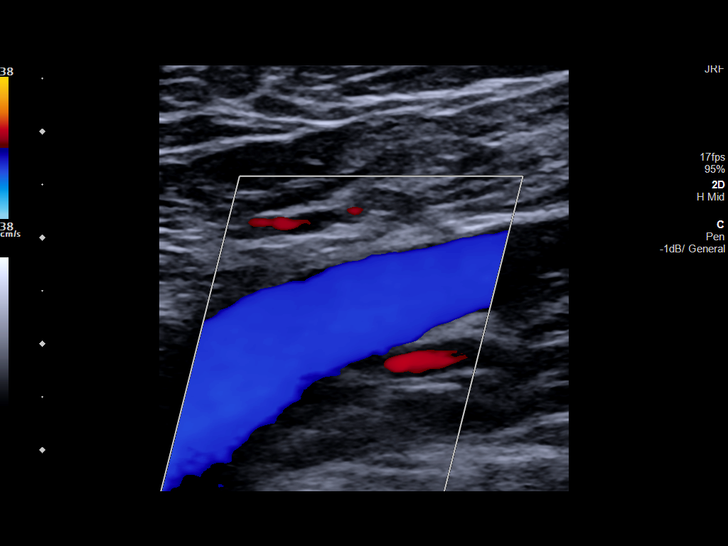
[im 9/33]
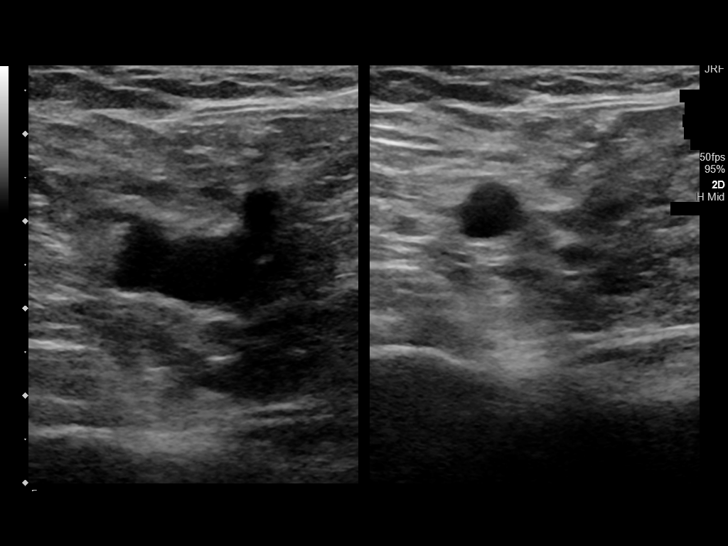
[im 12/33]
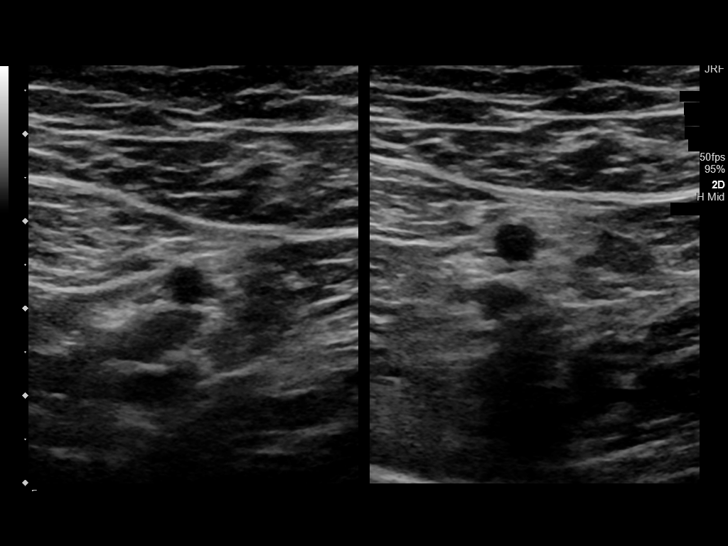
[im 14/33]
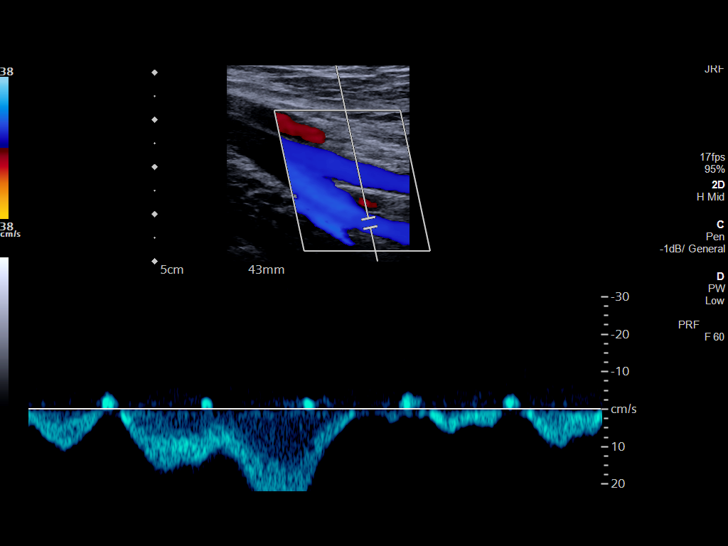
[im 17/33]
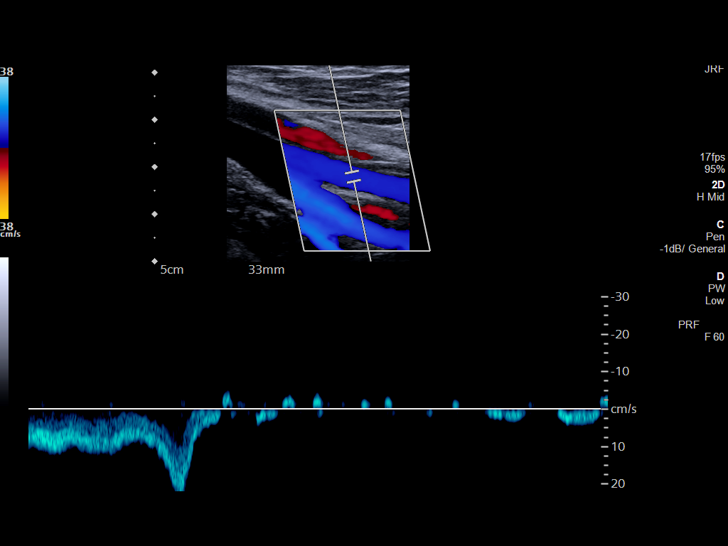
[im 19/33]
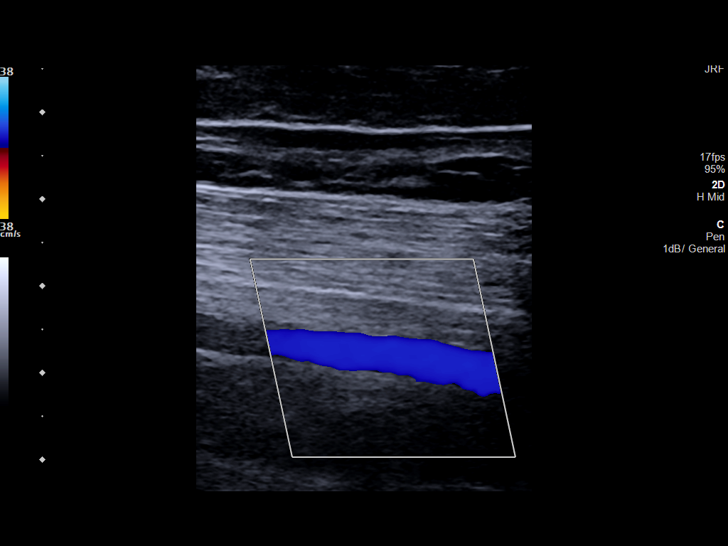
[im 21/33]
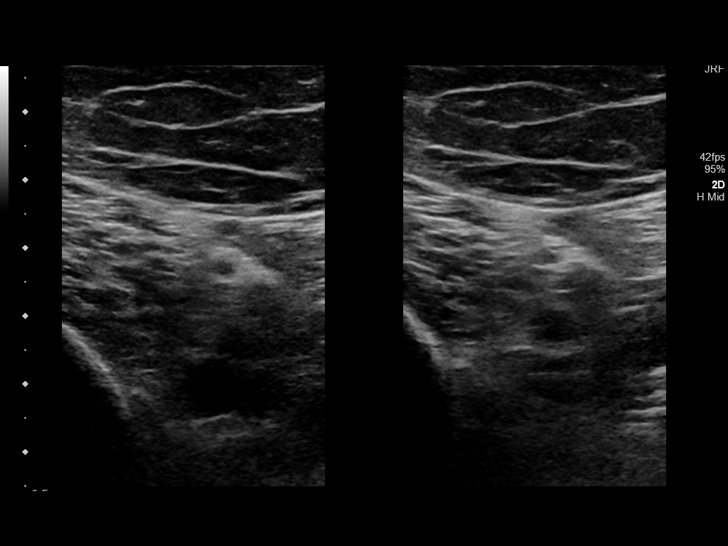
[im 24/33]
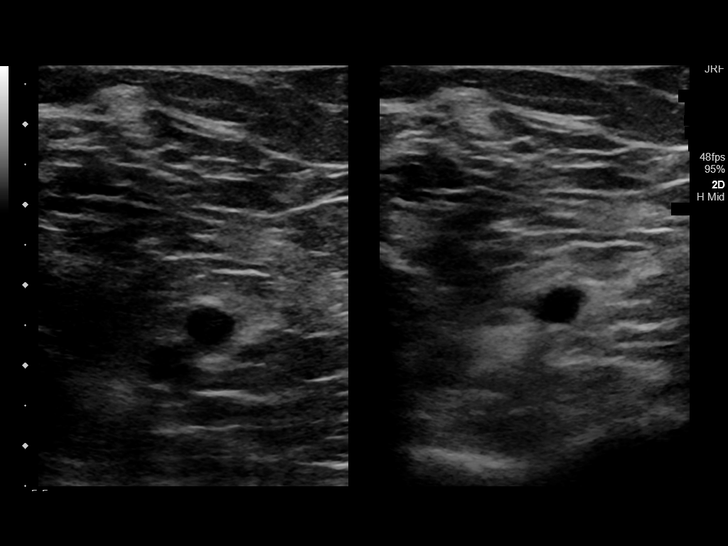
[im 27/33]
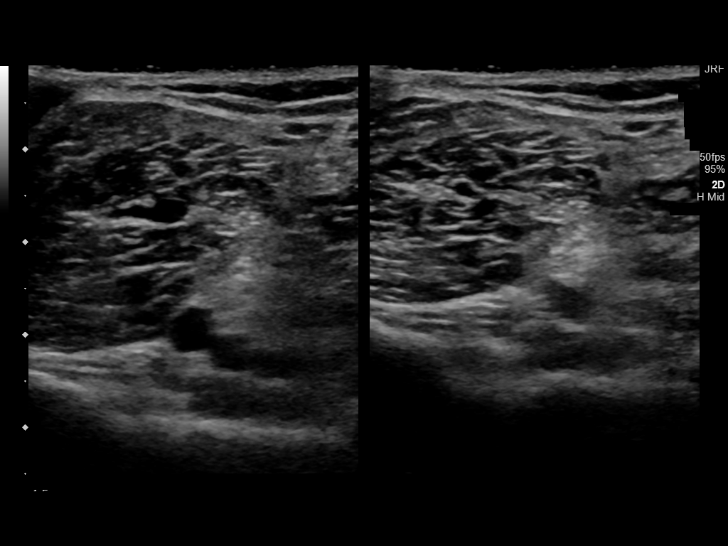
[im 30/33]
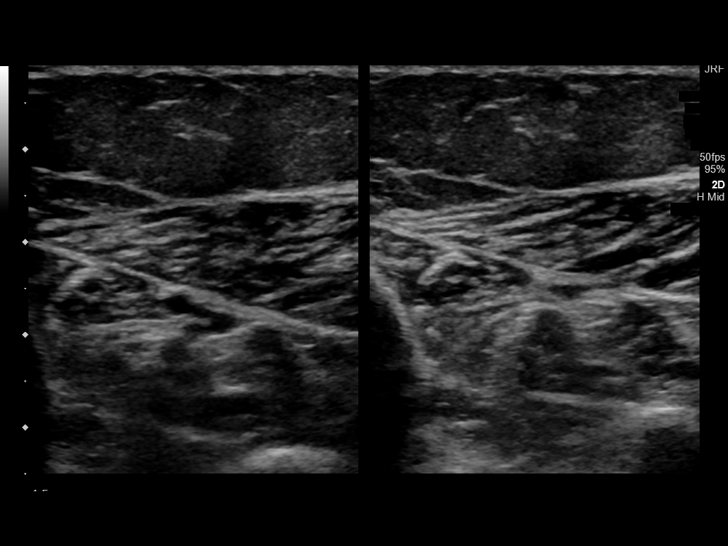
[im 33/33]
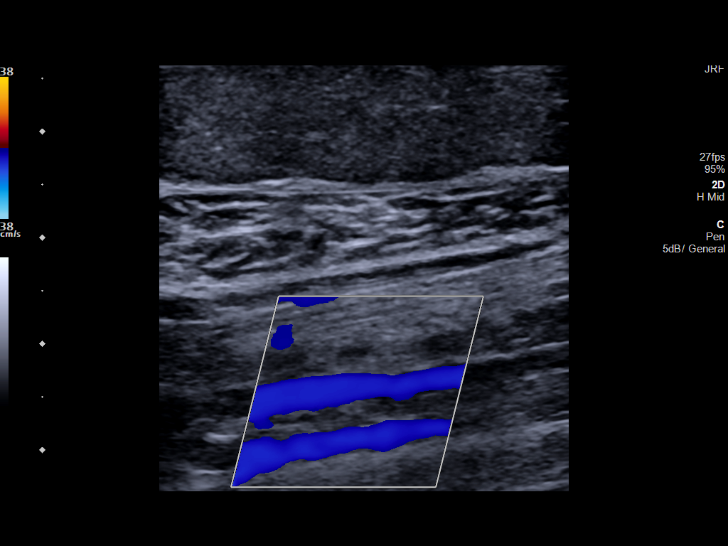

[13 of 24 positions shown; findings below may reference images not displayed]

FINDINGS: Contralateral Common Femoral Vein: Respiratory phasicity is normal
and symmetric with the symptomatic side. No evidence of thrombus.
Normal compressibility.

Common Femoral Vein: No evidence of thrombus. Normal
compressibility, respiratory phasicity and response to augmentation.

Saphenofemoral Junction: No evidence of thrombus. Normal
compressibility and flow on color Doppler imaging.

Profunda Femoral Vein: No evidence of thrombus. Normal
compressibility and flow on color Doppler imaging.

Femoral Vein: No evidence of thrombus. Normal compressibility,
respiratory phasicity and response to augmentation.

Popliteal Vein: No evidence of thrombus. Normal compressibility,
respiratory phasicity and response to augmentation.

Calf Veins: No evidence of thrombus. Normal compressibility and flow
on color Doppler imaging.

Superficial Great Saphenous Vein: No evidence of thrombus. Normal
compressibility.

Venous Reflux:  None.

Other Findings:  None.
IMPRESSION: No evidence of deep venous thrombosis.

## 2021-08-08 NOTE — ED Triage Notes (Signed)
Pt comes with c/o left breast pain for 4 days. Pt states pain, burning and she feels the lump has gotten worse.

## 2021-08-08 NOTE — ED Notes (Signed)
Pt has already been evaluated by doctor for L breast pain. Pt now up for discharge.

## 2021-08-08 NOTE — ED Provider Notes (Signed)
[facility] Emergency Department Provider Note   ____________________________________________   Event Date/Time   First MD Initiated Contact with Patient 08/08/21 [phone]     (approximate)  I have reviewed the triage vital signs and the nursing notes.   HISTORY  Chief Complaint Breast Pain    HPI Suzanne Day is a 30 y.o. female with past medical history of anxiety and migraines who presents to the ED complaining of breast pain.  Patient reports over the past 3 to 4 days she has noticed increasing tenderness to the left upper quadrant of her left breast.  She describes the pain as a dull ache that is sore to touch, but she denies any recent trauma to her breast.  She has not noticed any redness, swelling, or drainage either from nipple or the area of pain.  She has not had any fevers.  She reports taking ibuprofen for pain prior to arrival with some relief.        Past Medical History:  Diagnosis Date   Anxiety    Migraine headache    Sinus arrhythmia     There are no problems to display for this patient.   History reviewed. No pertinent surgical history.  Prior to Admission medications   Medication Sig Start Date End Date Taking? Authorizing Provider  Magnesium Oxide 250 MG TABS magnesium 250 mg (as magnesium oxide) tablet  TAKE 1 TABLET BY MOUTH EVERY DAY 11/21/20   [provider]  metoCLOPramide (REGLAN) 10 MG tablet Take 1 tablet (10 mg total) by mouth 3 (three) times daily with meals. 04/14/21 04/14/22  Triplett, Rulon Eisenmenger B, FNP  ondansetron (ZOFRAN-ODT) 4 MG disintegrating tablet Take 1 tablet (4 mg total) by mouth every 8 (eight) hours as needed for nausea or vomiting. 03/10/21   Triplett, Kasandra Knudsen, FNP  promethazine (PHENERGAN) 25 MG tablet Take 1 tablet (25 mg total) by mouth every 8 (eight) hours as needed for nausea, vomiting or refractory nausea / vomiting. 04/10/21   Tommie Sams, DO  SUMAtriptan (IMITREX) 50 MG tablet sumatriptan  50 mg tablet  PLEASE SEE ATTACHED FOR DETAILED DIRECTIONS    [provider]  topiramate (TOPAMAX) 25 MG tablet Take 25 mg by mouth 2 (two) times daily. 11/21/20   [provider]  triamcinolone ointment (KENALOG) 0.1 % Apply 1 application topically 2 (two) times daily. 04/10/21   Tommie Sams, DO    Allergies Bee venom, Covid-19 (mrna) vaccine, and Cashew nut (anacardium occidentale) skin test  No family history on file.  Social History Social History   Tobacco Use   Smoking status: Never   Smokeless tobacco: Never  Vaping Use   Vaping Use: Never used  Substance Use Topics   Alcohol use: Never    Comment: occ   Drug use: Never    Types: Cocaine    Review of Systems  Constitutional: No fever/chills Eyes: No visual changes. ENT: No sore throat. Cardiovascular: Denies chest pain. Respiratory: Denies shortness of breath. Gastrointestinal: No abdominal pain.  No nausea, no vomiting.  No diarrhea.  No constipation. Genitourinary: Negative for dysuria. Musculoskeletal: Negative for back pain. Skin: Negative for rash.  Positive for breast pain. Neurological: Negative for headaches, focal weakness or numbness.  ____________________________________________   PHYSICAL EXAM:  VITAL SIGNS: ED Triage Vitals  Enc Vitals Group     BP 08/08/21 0744 131/88     Pulse Rate 08/08/21 0744 87     Resp 08/08/21 0744 18  Temp 08/08/21 0744 98.7 F (37.1 C)     Temp Source 08/08/21 0744 Oral     SpO2 08/08/21 0744 100 %     Weight --      Height --      Head Circumference --      Peak Flow --      Pain Score 08/08/21 0745 10     Pain Loc --      Pain Edu? --      Excl. in GC? --     Constitutional: Alert and oriented. Eyes: Conjunctivae are normal. Head: Atraumatic. Nose: No congestion/rhinnorhea. Mouth/Throat: Mucous membranes are moist. Neck: Normal ROM Cardiovascular: Normal rate, regular rhythm. Grossly normal heart sounds. Respiratory: Normal  respiratory effort.  No retractions. Lungs CTAB. Gastrointestinal: Soft and nontender. No distention. Genitourinary: deferred Musculoskeletal: No lower extremity tenderness nor edema. Neurologic:  Normal speech and language. No gross focal neurologic deficits are appreciated. Skin:  Skin is warm, dry and intact. No rash noted.  Tenderness to palpation near left upper quadrant of breast with small nodule palpated.  No erythema, warmth, edema, induration, or fluctuance noted.  No nipple drainage noted. Psychiatric: Mood and affect are normal. Speech and behavior are normal.  ____________________________________________   LABS (all labs ordered are listed, but only abnormal results are displayed)  Labs Reviewed - No data to display   PROCEDURES  Procedure(s) performed (including Critical Care):  Procedures   ____________________________________________   INITIAL IMPRESSION / ASSESSMENT AND PLAN / ED COURSE      30 year old female with past medical history of anxiety and migraines who presents to the ED complaining of 3 to 4 days of tenderness to the left upper quadrant of her breast with no associated erythema, edema, warmth, or drainage.  There are no signs of abscess or cellulitis at this time, suspicion for malignancy is low and no indication for ultrasound at this time.  She is appropriate for outpatient management and patient was counseled on warm compresses and to alternate Tylenol and ibuprofen for pain.  She was counseled to follow-up with her PCP to consider ultrasound if symptoms persist.  She was counseled to return to the ED for new worsening symptoms, patient agrees with plan.      ____________________________________________   FINAL CLINICAL IMPRESSION(S) / ED DIAGNOSES  Final diagnoses:  Breast pain     ED Discharge Orders     None        Note:  This document was prepared using Dragon voice recognition software and may include unintentional dictation  errors.    Chesley Noon, MD 08/08/21 [phone]

## 2021-08-08 NOTE — ED Notes (Signed)
Says she has lump and breast pain--left side for about a week.  No distress.

## 2023-03-07 IMAGING — CT CT HEAD W/O CM
3 series · 16 of 47 positions shown, 19 images · non-contrast
Comparison: 03/29/2011

CLINICAL DATA: Headache

EXAM:
CT HEAD WITHOUT CONTRAST
TECHNIQUE: Contiguous axial images were obtained from the base of the skull
through the vertex without intravenous contrast.

[Series 3: head bone · axial · 0.41mm/px · z∈[+607,+741]mm · 10 of 79 slices shown, 13 images]
[im 6/79  brain]
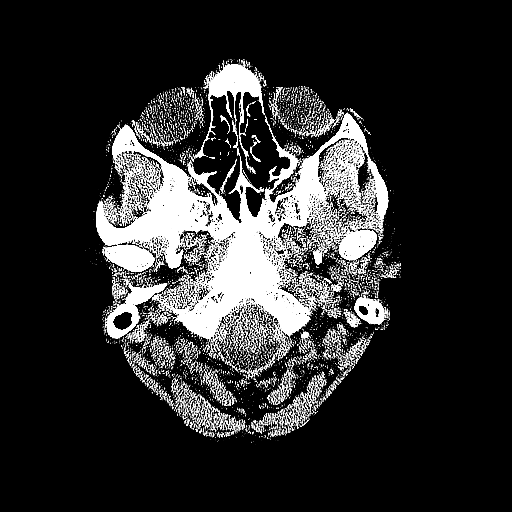
[im 6/79  bone]
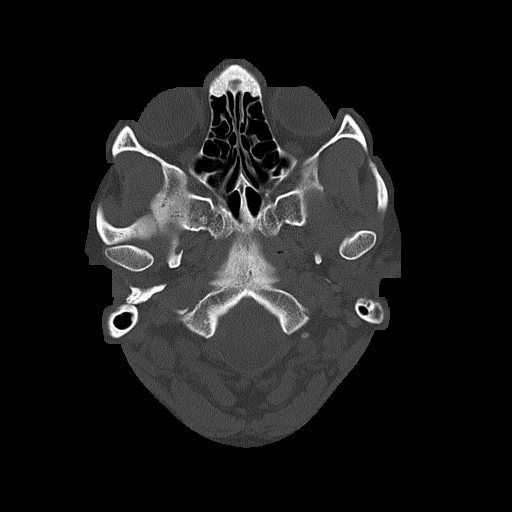
[im 14/79  brain]
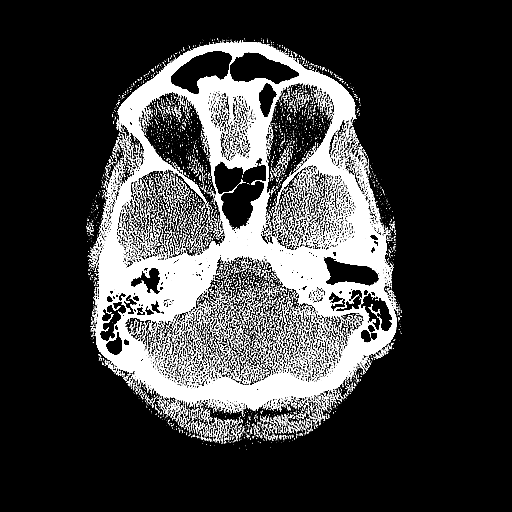
[im 22/79  brain]
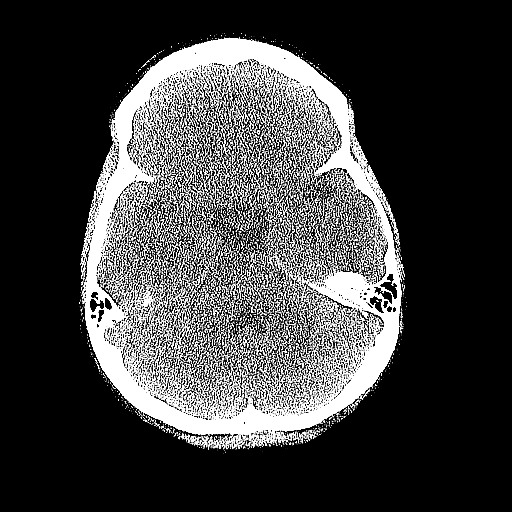
[im 27/79  brain]
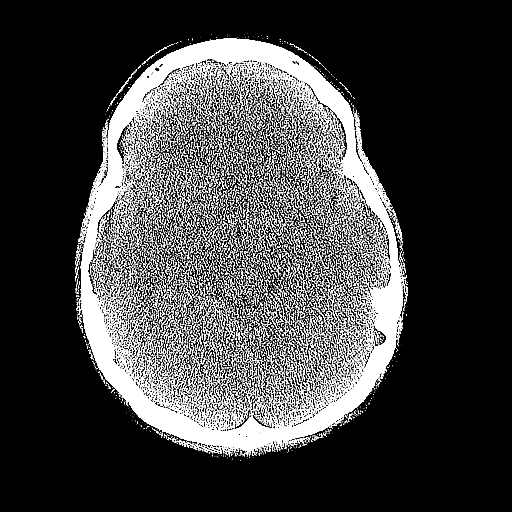
[im 35/79  brain]
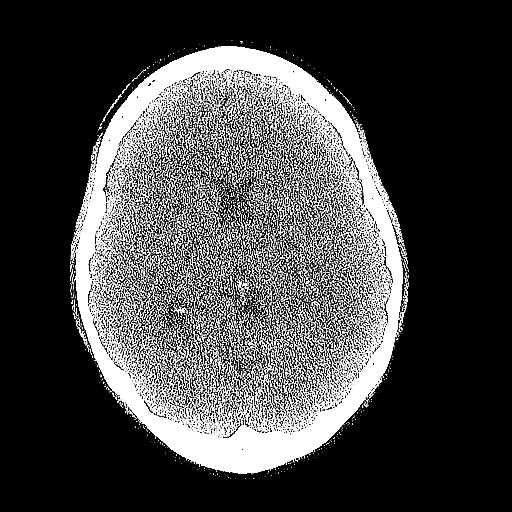
[im 35/79  bone]
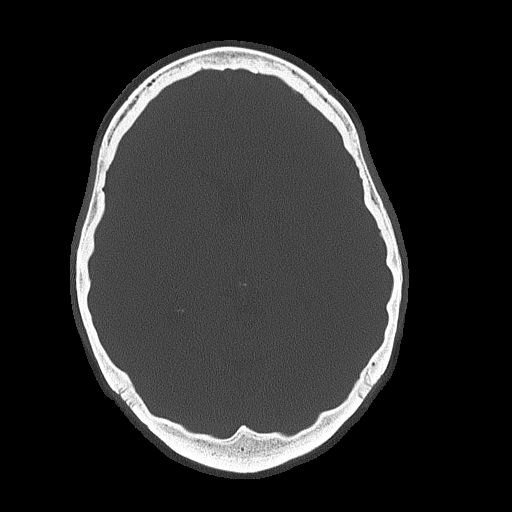
[im 44/79  brain]
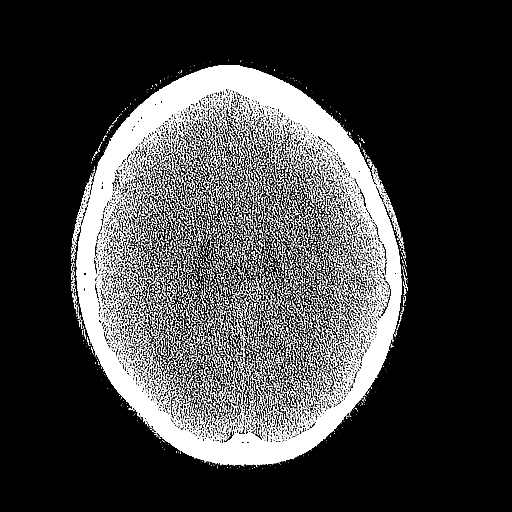
[im 52/79  brain]
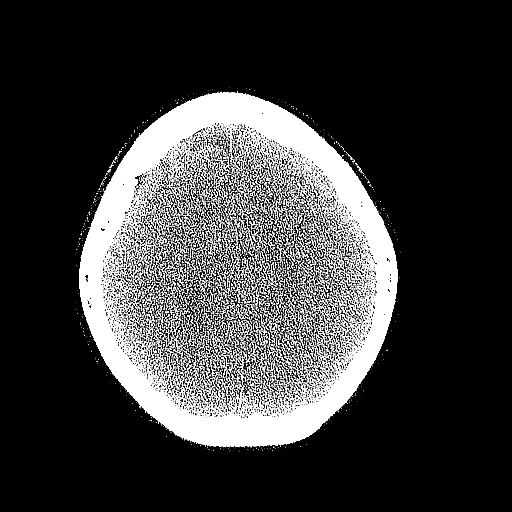
[im 60/79  brain]
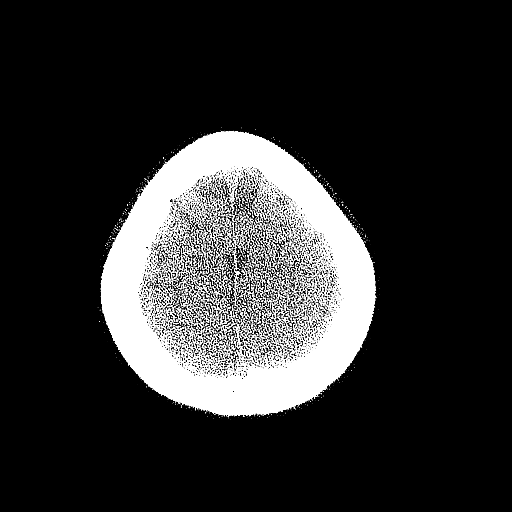
[im 65/79  brain]
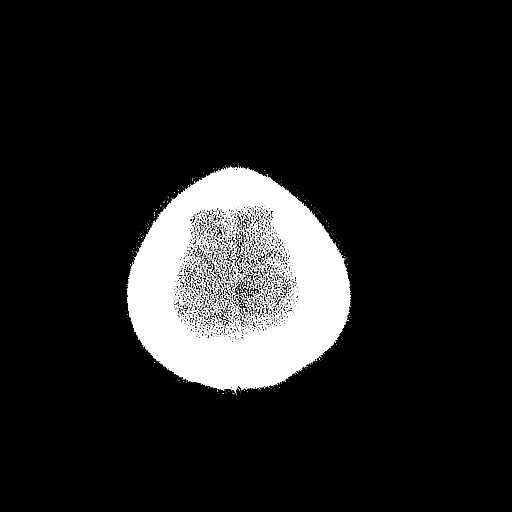
[im 65/79  bone]
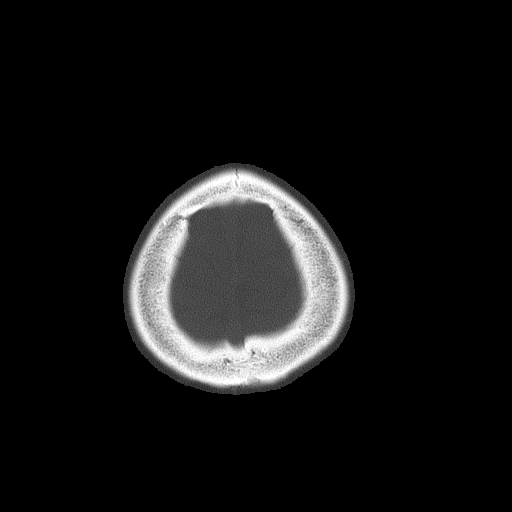
[im 73/79  brain]
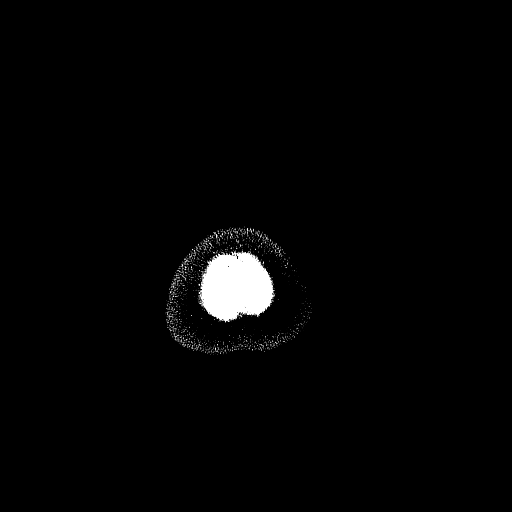

[Series 4: coronal soft tissue · coronal · 0.32mm/px · 3 of 68 slices shown]
[im 23/68  brain]
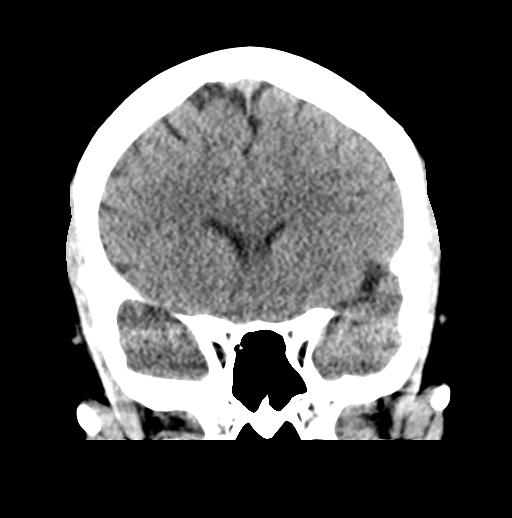
[im 30/68  brain]
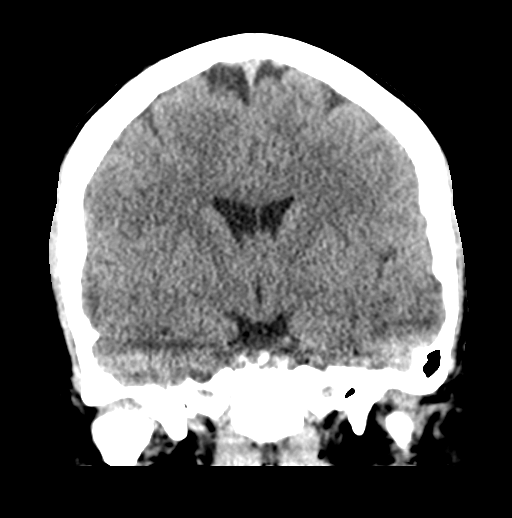
[im 38/68  brain]
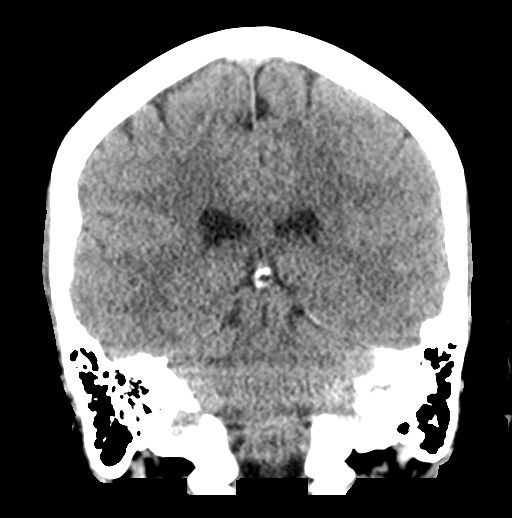

[Series 5: sagittal soft tissue · sagittal · 0.36mm/px · 3 of 57 slices shown]
[im 19/57  brain]
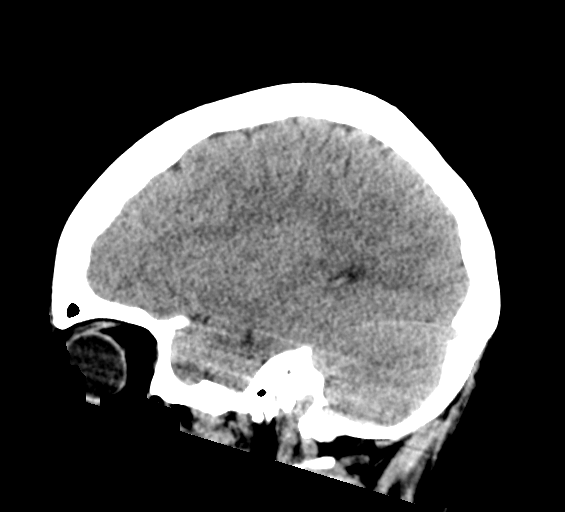
[im 29/57  brain]
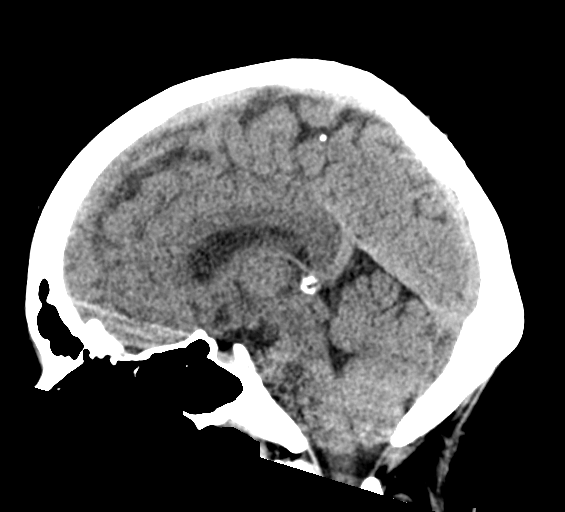
[im 38/57  brain]
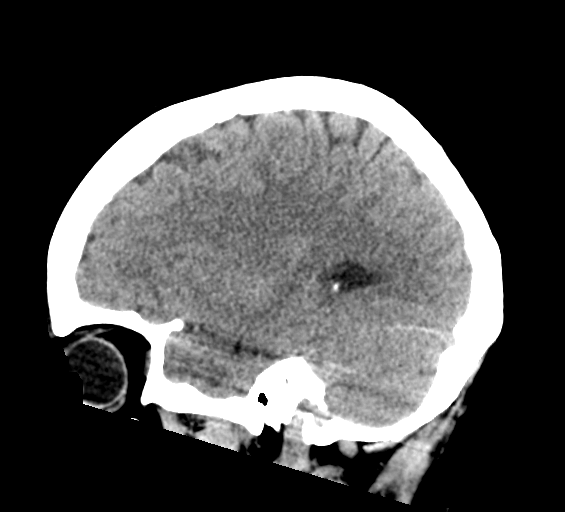

[16 of 47 positions shown; findings below may reference images not displayed]

FINDINGS: Brain: No evidence of acute infarction, hemorrhage, hydrocephalus,
extra-axial collection or mass lesion/mass effect.

Vascular: No hyperdense vessel or unexpected calcification.

Skull: Normal. Negative for fracture or focal lesion.

Sinuses/Orbits: No acute finding.

Other: None.
IMPRESSION: No acute intracranial process. No etiology seen for the patient's
headaches.

## 2023-10-22 ENCOUNTER — Other Ambulatory Visit: Payer: Self-pay

## 2023-10-22 ENCOUNTER — Emergency Department: Payer: Self-pay

## 2023-10-22 ENCOUNTER — Ambulatory Visit
Admission: EM | Admit: 2023-10-22 | Discharge: 2023-10-22 | Disposition: A | Discharge status: Home / Self Care | Payer: Self-pay | Attending: Emergency Medicine | Admitting: Emergency Medicine

## 2023-10-22 ENCOUNTER — Emergency Department
Admission: EM | Admit: 2023-10-22 | Discharge: 2023-10-23 | Disposition: A | Discharge status: Home / Self Care | Payer: Self-pay | Attending: Emergency Medicine | Admitting: Emergency Medicine

## 2023-10-22 DIAGNOSIS — R109 Unspecified abdominal pain: Secondary | ICD-10-CM

## 2023-10-22 DIAGNOSIS — R1084 Generalized abdominal pain: Secondary | ICD-10-CM

## 2023-10-22 DIAGNOSIS — R110 Nausea: Secondary | ICD-10-CM

## 2023-10-22 DIAGNOSIS — R197 Diarrhea, unspecified: Secondary | ICD-10-CM

## 2023-10-22 DIAGNOSIS — N12 Tubulo-interstitial nephritis, not specified as acute or chronic: Secondary | ICD-10-CM | POA: Insufficient documentation

## 2023-10-22 LAB — COMPREHENSIVE METABOLIC PANEL
ALT: 18 U/L (ref 0–44)
AST: 19 U/L (ref 15–41)
Albumin: 3.8 g/dL (ref 3.5–5.0)
Alkaline Phosphatase: 54 U/L (ref 38–126)
Anion gap: 7 (ref 5–15)
BUN: 10 mg/dL (ref 6–20)
CO2: 24 mmol/L (ref 22–32)
Calcium: 8.8 mg/dL — ABNORMAL LOW (ref 8.9–10.3)
Chloride: 106 mmol/L (ref 98–111)
Creatinine, Ser: 0.63 mg/dL (ref 0.44–1.00)
GFR, Estimated: >60 mL/min (ref >60)
Glucose, Bld: 99 mg/dL (ref 70–99)
Potassium: 4.2 mmol/L (ref 3.5–5.1)
Sodium: 137 mmol/L (ref 135–145)
Total Bilirubin: 0.3 mg/dL (ref 0.0–1.2)
Total Protein: 7.5 g/dL (ref 6.5–8.1)

## 2023-10-22 LAB — CBC WITH DIFFERENTIAL/PLATELET
Abs Immature Granulocytes: 0.03 10*3/uL (ref 0.00–0.07)
Basophils Absolute: 0.1 10*3/uL (ref 0.0–0.1)
Basophils Relative: 1 %
Eosinophils Absolute: 0.2 10*3/uL (ref 0.0–0.5)
Eosinophils Relative: 2 %
HCT: 37.2 % (ref 36.0–46.0)
Hemoglobin: 12.1 g/dL (ref 12.0–15.0)
Immature Granulocytes: 0 %
Lymphocytes Relative: 30 %
Lymphs Abs: 3.1 10*3/uL (ref 0.7–4.0)
MCH: 26.1 pg (ref 26.0–34.0)
MCHC: 32.5 g/dL (ref 30.0–36.0)
MCV: 80.2 fL (ref 80.0–100.0)
Monocytes Absolute: 0.7 10*3/uL (ref 0.1–1.0)
Monocytes Relative: 7 %
Neutro Abs: 6.2 10*3/uL (ref 1.7–7.7)
Neutrophils Relative %: 60 %
Platelets: 349 10*3/uL (ref 150–400)
RBC: 4.64 MIL/uL (ref 3.87–5.11)
RDW: 14.3 % (ref 11.5–15.5)
WBC: 10.3 10*3/uL (ref 4.0–10.5)
nRBC: 0 % (ref 0.0–0.2)

## 2023-10-22 LAB — POCT URINALYSIS DIP (MANUAL ENTRY)
Bilirubin, UA: NEGATIVE
Blood, UA: NEGATIVE
Glucose, UA: NEGATIVE mg/dL
Ketones, POC UA: NEGATIVE mg/dL
Leukocytes, UA: NEGATIVE
Nitrite, UA: NEGATIVE
Protein Ur, POC: NEGATIVE mg/dL
Spec Grav, UA: 1.01 (ref 1.010–1.025)
Urobilinogen, UA: 0.2 U/dL
pH, UA: 6 (ref 5.0–8.0)

## 2023-10-22 LAB — URINALYSIS, ROUTINE W REFLEX MICROSCOPIC
Bacteria, UA: NONE SEEN
Bilirubin Urine: NEGATIVE
Glucose, UA: NEGATIVE mg/dL
Hgb urine dipstick: NEGATIVE
Ketones, ur: 5 mg/dL — AB
Nitrite: NEGATIVE
Protein, ur: NEGATIVE mg/dL
Specific Gravity, Urine: 1.021 (ref 1.005–1.030)
pH: 5 (ref 5.0–8.0)

## 2023-10-22 LAB — LIPASE, BLOOD: Lipase: 27 U/L (ref 11–51)

## 2023-10-22 LAB — POCT URINE PREGNANCY: Preg Test, Ur: NEGATIVE

## 2023-10-22 NOTE — Discharge Instructions (Addendum)
Follow up with your primary care provider tomorrow.  Go to the emergency department if you have worsening symptoms.

## 2023-10-22 NOTE — ED Triage Notes (Signed)
Pt reports right side flank pain that began 3 days ago, pt also reports n/d after eating. Pt denies urinary symptoms.

## 2023-10-22 NOTE — ED Triage Notes (Signed)
Patient to Urgent Care with complaints of right sided flank pain and "swelling". States she has had UTI's in the past with the same symptoms.   Also reports cramping/ nausea and diarrhea when she eats. Wife with same symptoms.   Symptoms started 3 days ago. Denies any known fevers.   Meds: cranberry/ water.

## 2023-10-22 NOTE — ED Provider Notes (Signed)
Renaldo Fiddler    CSN: [id number] Arrival date & time: 10/22/23  1818      History   Chief Complaint Chief Complaint  Patient presents with   Urinary Frequency    HPI Suzanne Day is a 33 y.o. female.  Patient presents with 3-day history of right flank pain, abdominal cramping, nausea, diarrhea.  No fever, vomiting, dysuria, hematuria, vaginal discharge, pelvic pain.  No OTC medication taken today.  The history is provided by the patient and medical records.    Past Medical History:  Diagnosis Date   Anxiety    Migraine headache    Sinus arrhythmia     There are no active problems to display for this patient.   History reviewed. No pertinent surgical history.  OB History   No obstetric history on file.      Home Medications    Prior to Admission medications   Medication Sig Start Date End Date Taking? Authorizing Provider  Magnesium Oxide 250 MG TABS magnesium 250 mg (as magnesium oxide) tablet  TAKE 1 TABLET BY MOUTH EVERY DAY 11/21/20   [provider]  metoCLOPramide (REGLAN) 10 MG tablet Take 1 tablet (10 mg total) by mouth 3 (three) times daily with meals. Patient not taking: Reported on 10/22/2023 04/14/21 04/14/22  Kem Boroughs B, FNP  ondansetron (ZOFRAN-ODT) 4 MG disintegrating tablet Take 1 tablet (4 mg total) by mouth every 8 (eight) hours as needed for nausea or vomiting. 03/10/21   Triplett, Kasandra Knudsen, FNP  promethazine (PHENERGAN) 25 MG tablet Take 1 tablet (25 mg total) by mouth every 8 (eight) hours as needed for nausea, vomiting or refractory nausea / vomiting. 04/10/21   Tommie Sams, DO  SUMAtriptan (IMITREX) 50 MG tablet sumatriptan 50 mg tablet  PLEASE SEE ATTACHED FOR DETAILED DIRECTIONS Patient not taking: Reported on 10/22/2023    [provider]  topiramate (TOPAMAX) 25 MG tablet Take 25 mg by mouth 2 (two) times daily. Patient not taking: Reported on 10/22/2023 11/21/20   [provider]  triamcinolone  ointment (KENALOG) 0.1 % Apply 1 application topically 2 (two) times daily. 04/10/21   Tommie Sams, DO    Family History History reviewed. No pertinent family history.  Social History Social History   Tobacco Use   Smoking status: Never   Smokeless tobacco: Never  Vaping Use   Vaping status: Never Used  Substance Use Topics   Alcohol use: Never    Comment: occ   Drug use: Never    Types: Cocaine     Allergies   Bee venom, Covid-19 (mrna) vaccine, and Cashew nut (anacardium occidentale) skin test   Review of Systems Review of Systems  Constitutional:  Negative for chills and fever.  Gastrointestinal:  Positive for abdominal pain, diarrhea and nausea. Negative for vomiting.  Genitourinary:  Positive for flank pain. Negative for dysuria, hematuria, pelvic pain and vaginal discharge.     Physical Exam Triage Vital Signs ED Triage Vitals  Encounter Vitals Group     BP      Systolic BP Percentile      Diastolic BP Percentile      Pulse      Resp      Temp      Temp src      SpO2      Weight      Height      Head Circumference      Peak Flow      Pain Score  Pain Loc      Pain Education      Exclude from Growth Chart    No data found.  Updated Vital Signs BP (!) 133/95   Pulse 80   Temp 97.7 F (36.5 C)   Resp 18   LMP 10/12/2023   SpO2 99%   Visual Acuity Right Eye Distance:   Left Eye Distance:   Bilateral Distance:    Right Eye Near:   Left Eye Near:    Bilateral Near:     Physical Exam Constitutional:      General: She is not in acute distress. HENT:     Mouth/Throat:     Mouth: Mucous membranes are moist.  Cardiovascular:     Rate and Rhythm: Normal rate and regular rhythm.  Pulmonary:     Effort: Pulmonary effort is normal. No respiratory distress.  Abdominal:     General: Bowel sounds are normal.     Palpations: Abdomen is soft.     Tenderness: There is no abdominal tenderness. There is no right CVA tenderness, left CVA  tenderness, guarding or rebound.  Neurological:     Mental Status: She is alert.      UC Treatments / Results  Labs (all labs ordered are listed, but only abnormal results are displayed) Labs Reviewed  POCT URINALYSIS DIP (MANUAL ENTRY) - Normal  POCT URINE PREGNANCY    EKG   Radiology No results found.  Procedures Procedures (including critical care time)  Medications Ordered in UC Medications - No data to display  Initial Impression / Assessment and Plan / UC Course  I have reviewed the triage vital signs and the nursing notes.  Pertinent labs & imaging results that were available during my care of the patient were reviewed by me and considered in my medical decision making (see chart for details).    Right flank pain, abdominal pain, nausea, diarrhea.  Afebrile.  Abdomen is soft and nontender with good bowel sounds.  No CVAT.  Urine normal.  Urine pregnancy negative.  Patient declines antinausea medication.  Instructed her to follow-up with her PCP tomorrow.  ED precautions given.  Education provided on flank pain, abdominal pain, nausea, diarrhea.  She agrees to plan of care.  Final Clinical Impressions(s) / UC Diagnoses   Final diagnoses:  Acute right flank pain  Generalized abdominal pain  Nausea without vomiting  Diarrhea, unspecified type     Discharge Instructions      Follow up with your primary care provider tomorrow.  Go to the emergency department if you have worsening symptoms.        ED Prescriptions   None    PDMP not reviewed this encounter.   Mickie Bail, NP 10/22/23 [phone]

## 2023-10-23 MED ORDER — CEFDINIR 300 MG PO CAPS: 300.0000 mg | ORAL_CAPSULE | Freq: Two times a day (BID) | ORAL | 0 refills | Status: AC

## 2023-10-23 MED ORDER — ONDANSETRON 4 MG PO TBDP: 4.0000 mg | ORAL_TABLET | Freq: Three times a day (TID) | ORAL | 0 refills | Status: AC | PRN

## 2023-10-23 NOTE — ED Notes (Signed)
..  The patient is A&OX4, ambulatory at d/c with independent steady gait, NAD. Pt verbalized understanding of d/c instructions, prescriptions and follow up care.

## 2023-10-23 NOTE — ED Provider Notes (Signed)
[facility] Provider Note    Event Date/Time   First MD Initiated Contact with Patient 10/23/23 0001     (approximate)   History   Chief Complaint Flank Pain   HPI  Suzanne Day is a 33 y.o. female with past medical history of anxiety and migraines who presents to the ED complaining of flank pain.  Patient reports that she has had 3 days of increasing pain in her right flank.  Pain is described as sharp and not exacerbated or alleviated by anything in particular.  She has occasionally felt nauseous but has not vomited and denies any changes in her bowel movements.  She denies any fevers or dysuria, but does state that symptoms feel similar to prior kidney infections.     Physical Exam   Triage Vital Signs: ED Triage Vitals  Encounter Vitals Group     BP 10/22/23 2035 (!) 131/95     Systolic BP Percentile --      Diastolic BP Percentile --      Pulse Rate 10/22/23 2035 84     Resp 10/22/23 2035 18     Temp 10/22/23 2035 98.1 F (36.7 C)     Temp src --      SpO2 10/22/23 2035 100 %     Weight 10/22/23 2034 185 lb (83.9 kg)     Height 10/22/23 2034 5\' 3"  (1.6 m)     Head Circumference --      Peak Flow --      Pain Score 10/22/23 2033 8     Pain Loc --      Pain Education --      Exclude from Growth Chart --     Most recent vital signs: Vitals:   10/22/23 2035  BP: (!) 131/95  Pulse: 84  Resp: 18  Temp: 98.1 F (36.7 C)  SpO2: 100%    Constitutional: Alert and oriented. Eyes: Conjunctivae are normal. Head: Atraumatic. Nose: No congestion/rhinnorhea. Mouth/Throat: Mucous membranes are moist.  Cardiovascular: Normal rate, regular rhythm. Grossly normal heart sounds.  2+ radial pulses bilaterally. Respiratory: Normal respiratory effort.  No retractions. Lungs CTAB. Gastrointestinal: Soft and nontender.  Right CVA tenderness noted.  No distention. Musculoskeletal: No lower extremity tenderness nor edema.  Neurologic:  Normal  speech and language. No gross focal neurologic deficits are appreciated.    ED Results / Procedures / Treatments   Labs (all labs ordered are listed, but only abnormal results are displayed) Labs Reviewed  URINALYSIS, ROUTINE W REFLEX MICROSCOPIC - Abnormal; Notable for the following components:      Result Value   Color, Urine YELLOW (*)    APPearance HAZY (*)    Ketones, ur 5 (*)    Leukocytes,Ua MODERATE (*)    All other components within normal limits  COMPREHENSIVE METABOLIC PANEL - Abnormal; Notable for the following components:   Calcium 8.8 (*)    All other components within normal limits  URINE CULTURE  CBC WITH DIFFERENTIAL/PLATELET  LIPASE, BLOOD  POC URINE PREG, ED    RADIOLOGY CT renal protocol reviewed and interpreted by me with no stones or hydronephrosis noted.  PROCEDURES:  Critical Care performed: No  Procedures   MEDICATIONS ORDERED IN ED: Medications - No data to display   IMPRESSION / MDM / ASSESSMENT AND PLAN / ED COURSE  I reviewed the triage vital signs and the nursing notes.  33 y.o. female with past medical history of anxiety and migraines who presents to the ED complaining of 3 days of increasing right flank pain with nausea.  Patient's presentation is most consistent with acute presentation with potential threat to life or bodily function.  Differential diagnosis includes, but is not limited to, pyelonephritis, kidney stone, ectopic pregnancy, pancreatitis, hepatitis, cholecystitis, biliary colic.  Patient nontoxic-appearing and in no acute distress, vital signs are unremarkable.  She has a benign abdominal exam, does have CVA tenderness on the right.  CT renal protocol is negative for acute process, labs without significant anemia, leukocytosis, electrolyte abnormality, or AKI.  LFTs and lipase are unremarkable, pregnancy testing from urgent care was negative.  Urinalysis borderline for UTI, but given patient's  history of prior kidney infections, will treat with antibiotics.  Urine was sent for culture and we will treat with cefdinir, patient counseled to return to the ED for new or worsening symptoms.  Patient agrees with plan.      FINAL CLINICAL IMPRESSION(S) / ED DIAGNOSES   Final diagnoses:  Flank pain  Pyelonephritis     Rx / DC Orders   ED Discharge Orders          Ordered    cefdinir (OMNICEF) 300 MG capsule  2 times daily        10/23/23 0214    ondansetron (ZOFRAN-ODT) 4 MG disintegrating tablet  Every 8 hours PRN        10/23/23 0214             Note:  This document was prepared using Dragon voice recognition software and may include unintentional dictation errors.   Chesley Noon, MD 10/23/23 [phone]

## 2023-10-24 LAB — URINE CULTURE: Culture: <10000 — AB
